# Patient Record
Sex: Female | Born: 1980 | Race: Black or African American | Hispanic: No | Marital: Married | Smoking: Never smoker
Health system: Southern US, Community
[De-identification: ages and names within clinical notes are randomized; demographics above are authoritative.]

## PROBLEM LIST (undated history)

## (undated) DIAGNOSIS — T7840XA Allergy, unspecified, initial encounter: Secondary | ICD-10-CM

## (undated) DIAGNOSIS — K219 Gastro-esophageal reflux disease without esophagitis: Secondary | ICD-10-CM

## (undated) DIAGNOSIS — N63 Unspecified lump in unspecified breast: Secondary | ICD-10-CM

## (undated) DIAGNOSIS — F419 Anxiety disorder, unspecified: Secondary | ICD-10-CM

## (undated) DIAGNOSIS — R011 Cardiac murmur, unspecified: Secondary | ICD-10-CM

## (undated) HISTORY — DX: Gastro-esophageal reflux disease without esophagitis: K21.9

## (undated) HISTORY — DX: Anxiety disorder, unspecified: F41.9

## (undated) HISTORY — DX: Allergy, unspecified, initial encounter: T78.40XA

## (undated) HISTORY — DX: Cardiac murmur, unspecified: R01.1

---
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
## 2001-06-21 ENCOUNTER — Other Ambulatory Visit: Admission: RE | Admit: 2001-06-21 | Discharge: 2001-06-21 | Payer: Self-pay

## 2005-04-29 ENCOUNTER — Emergency Department (HOSPITAL_COMMUNITY): Admission: EM | Admit: 2005-04-29 | Discharge: 2005-04-29 | Payer: Self-pay | Admitting: Emergency Medicine

## 2006-12-04 ENCOUNTER — Emergency Department (HOSPITAL_COMMUNITY): Admission: EM | Admit: 2006-12-04 | Discharge: 2006-12-04 | Payer: Self-pay | Admitting: Emergency Medicine

## 2007-04-24 ENCOUNTER — Emergency Department (HOSPITAL_COMMUNITY): Admission: EM | Admit: 2007-04-24 | Discharge: 2007-04-24 | Payer: Self-pay | Admitting: Family Medicine

## 2008-12-04 ENCOUNTER — Encounter: Admission: RE | Admit: 2008-12-04 | Discharge: 2009-03-04 | Payer: Self-pay | Admitting: Obstetrics and Gynecology

## 2009-03-30 ENCOUNTER — Inpatient Hospital Stay (HOSPITAL_COMMUNITY): Admission: AD | Admit: 2009-03-30 | Discharge: 2009-03-30 | Payer: Self-pay | Admitting: Obstetrics and Gynecology

## 2009-06-07 ENCOUNTER — Inpatient Hospital Stay (HOSPITAL_COMMUNITY): Admission: AD | Admit: 2009-06-07 | Discharge: 2009-06-09 | Payer: Self-pay | Admitting: Obstetrics and Gynecology

## 2009-08-13 ENCOUNTER — Encounter: Payer: Self-pay | Admitting: Family Medicine

## 2009-08-19 ENCOUNTER — Ambulatory Visit: Payer: Self-pay | Admitting: Family Medicine

## 2009-08-19 DIAGNOSIS — E669 Obesity, unspecified: Secondary | ICD-10-CM | POA: Insufficient documentation

## 2009-08-19 DIAGNOSIS — E282 Polycystic ovarian syndrome: Secondary | ICD-10-CM | POA: Insufficient documentation

## 2010-05-06 NOTE — Miscellaneous (Signed)
Summary: nutrition appt  Clinical Lists Changes rec'd a form showing that she has an appt with Dr. Gerilyn Pilgrim at [facility] 08/19/09. she is not here that am. can offer 3pm that day or another day. asked that she call us back to reschedule..referral form to Dr. Gerilyn Pilgrim chart box.Marland KitchenMarland KitchenGolden Circle RN  Aug 13, 2009 3:53 PM   I was checking the wrong day. she does have an appt that day. called her to tell her appt time & date are fine. she laughed & was going to keep this appt. I apologized for looking at the wrong day.Golden Circle RN  Aug 13, 2009 4:10 PM

## 2010-05-06 NOTE — Assessment & Plan Note (Signed)
Summary: NP,TCB   Vital Signs:  Patient profile:   30 year old female Height:      67 inches Weight:      275.9 pounds BMI:     43.37  Vitals Entered By: Wyona Almas PHD (Aug 19, 2009 8:54 AM)  History of Present Illness: Assessment:  Spent 60 minutes with pt.  Crystal Salinas lives with her 10-wk-old son and 5-YO daughter.  She has been diagnosed w/ PCOS, and was taking Metformin as well as phentermine prior to being pregnant w/ her son.  She has a strong family hx of both DM and CVD, and wants to avoid this for herself.  Tamala eats 3 meals and variable snacks daily.  24-hr recall suggests kcal intake of 1500-1600: (Slept 8 - 10 AM after work Sat 7 p-Sun 7 a); L (2:30 PM)- [place] sandwich w/ cheese, pickles, 1 tbsp mayo, 3 grape tomatoes, water, 1 oz chips; Snk (9 PM)- Fiber One bar; D (11:30 PM)- chsburger w/ let & tomato, pickles, 7 onion rings, water; Snk (3 AM)- protein drink, 1 apple; B (7:30 AM)- 3 pancakes, 1 tbsp margarine, 1 tbsp syrup, water.  Chairty walks with a friend 3-4 miles 3-4 X wk.  She has recently started trying to take stairs vs. elevator, etc.  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  Not sure yet.    Nutrition Diagnosis:  Excessive energy intake (NI-1.5) related to expenditure as evidenced by BMI of 43.  Inappropriate intake of types of carbohydrate (NI-53.3) related to starchy food choices as evidenced by cheeseburger, oinion rings, and pancakes consumed yesterday.   Intervention: See Patient Instructions.    Monitoring/Eval:  Dietary intake, body weight, and exercise at 3-4-wk F/U.       Other Orders: Inital Assessment Each - [facility] [phone])  Patient Instructions: 1)  Do your best to get adequate sleep, i.e., plan your Sundays when you will go to church, and plan your sleep around that schedule.   2)  No more than 5 hours between eating. Eat at least 3 meals and 1-2 snacks per day.  3)  When  choosing carbohydrates, look for those that are low-glycemic (high-fiber).   4)  Obtain twice as many veg's as protein or carbohydrate foods for both lunch and dinner.  Add frozen veg's to spaghetti sauce, soup, etc.   5)  Limit fat; buy low-fat dressing, mayo, etc.  Choose lean meats, such as fish, poultry, and beef/pork tenderloin. 6)  Limit fish to 6 oz 2 X wk (less is probably better).   7)  Continue your walking, and pay attention to getting as much activity as you can during your regular day.   8)  Schedule an appt for 3-4 wk.   9)  Call Dr. Gerilyn Pilgrim at [phone] if any Qs.

## 2010-06-30 LAB — CBC
HCT: 41 % (ref 36.0–46.0)
MCV: 76.9 fL — ABNORMAL LOW (ref 78.0–100.0)
MCV: 77.9 fL — ABNORMAL LOW (ref 78.0–100.0)
RDW: 17.5 % — ABNORMAL HIGH (ref 11.5–15.5)
WBC: 10.1 10*3/uL (ref 4.0–10.5)

## 2010-06-30 LAB — RPR: RPR Ser Ql: NONREACTIVE

## 2010-07-07 LAB — URINALYSIS, ROUTINE W REFLEX MICROSCOPIC
Glucose, UA: NEGATIVE mg/dL
Protein, ur: NEGATIVE mg/dL
pH: 6 (ref 5.0–8.0)

## 2010-07-07 LAB — URINE CULTURE

## 2011-01-16 LAB — RAPID STREP SCREEN (MED CTR MEBANE ONLY): Streptococcus, Group A Screen (Direct): NEGATIVE

## 2011-04-07 NOTE — L&D Delivery Note (Signed)
Delivery Note Pt went from 2cm to C/C?+2 in 20 minutes after getting up to urinate.  At 2:36 AM a viable female was delivered via  (Presentation ROA: ;  ).  APGAR:9/9 , ; weight .  pending Placenta status: intact with 3VC , .  Cord:  with the following complications:none .  Cord pH: n/a  Anesthesia:  none Episiotomy: none Lacerations: none Suture Repair: n/a Est. Blood Loss (mL): 200  Mom to postpartum.  Baby to nursery-stable.  CRESENZO-DISHMAN,Nilsa Macht 01/14/2012, 2:57 AM

## 2011-07-09 ENCOUNTER — Telehealth (HOSPITAL_COMMUNITY): Payer: Self-pay | Admitting: Dietician

## 2011-07-09 LAB — OB RESULTS CONSOLE HEPATITIS B SURFACE ANTIGEN: Hepatitis B Surface Ag: NEGATIVE

## 2011-07-09 LAB — OB RESULTS CONSOLE HIV ANTIBODY (ROUTINE TESTING): HIV: NONREACTIVE

## 2011-07-09 LAB — OB RESULTS CONSOLE RUBELLA ANTIBODY, IGM: Rubella: IMMUNE

## 2011-07-09 LAB — OB RESULTS CONSOLE RPR: RPR: NONREACTIVE

## 2011-07-09 NOTE — Telephone Encounter (Signed)
Received referral via fax from [facility] for dx: obesity, pregnancy, pt request.

## 2011-07-09 NOTE — Telephone Encounter (Signed)
Appointment scheduled for 07/13/2011 at 2:00 PM. Mailed appointment confirmation to pt home via [place] Mail.

## 2011-07-13 ENCOUNTER — Telehealth (HOSPITAL_COMMUNITY): Payer: Self-pay | Admitting: Dietician

## 2011-07-13 NOTE — Telephone Encounter (Signed)
Appointment rescheduled for 07/16/11 at 11:00 AM.

## 2011-07-16 ENCOUNTER — Telehealth (HOSPITAL_COMMUNITY): Payer: Self-pay | Admitting: Dietician

## 2011-07-16 NOTE — Telephone Encounter (Signed)
Left pt message that appointment scheduled on 07/16/11 at 11 AM to be rescheduled due to emergency conflict. Offered appointment later today or on Monday 07/20/11 or Wednesday, 07/22/11.

## 2011-07-20 NOTE — Telephone Encounter (Signed)
Pt has not responded to voicemails. Sent letter to pt home via [place] [employer] in attempt to contact pt to schedule appointment.

## 2011-07-23 NOTE — Telephone Encounter (Signed)
Pt has not responded to multiple attempts to contact to reschedule appointment. Referral filed.

## 2012-01-05 LAB — OB RESULTS CONSOLE ABO/RH: RH Type: POSITIVE

## 2012-01-08 ENCOUNTER — Inpatient Hospital Stay (HOSPITAL_COMMUNITY)
Admission: AD | Admit: 2012-01-08 | Discharge: 2012-01-08 | Disposition: A | Discharge status: Home / Self Care | Payer: Medicaid Other | Source: Ambulatory Visit | Attending: Obstetrics & Gynecology | Admitting: Obstetrics & Gynecology

## 2012-01-08 ENCOUNTER — Encounter (HOSPITAL_COMMUNITY): Payer: Self-pay | Admitting: *Deleted

## 2012-01-08 DIAGNOSIS — R1031 Right lower quadrant pain: Secondary | ICD-10-CM | POA: Insufficient documentation

## 2012-01-08 DIAGNOSIS — O479 False labor, unspecified: Secondary | ICD-10-CM | POA: Insufficient documentation

## 2012-01-08 NOTE — MAU Note (Signed)
Was going to work and had sharp pain in RLQ. Now having sharp, cramping in lower abd.

## 2012-01-08 NOTE — MAU Note (Signed)
Patient off efm to walk for one hour per P. Thad Ranger MD

## 2012-01-08 NOTE — MAU Provider Note (Signed)
  History     CSN: [id number]  Arrival date and time: 01/08/12 1930   None     Chief Complaint  Patient presents with  . Abdominal Pain   HPI 31 y.o. G3P2002 at [redacted]w[redacted]d by 11 week sono with lower right sided pain since about 6:30 PM. Losing mucous plug all week. In prior pregnancies did not feel strong contractions at all. Baby moving well. Not sure about loss of fluid. Normal pregnancy. Last baby did not feel contractions. "Loose" 1 cm in office on Tuesday. Last baby weighed 9 lb, 12 oz per mom, no problems with delivery.  OB History    Grav Para Term Preterm Abortions TAB SAB Ect Mult Living   3 2 2       2       Past Medical History  Diagnosis Date  . No pertinent past medical history     No past surgical history on file.  No family history on file.  History  Substance Use Topics  . Smoking status: Never Smoker   . Smokeless tobacco: Never Used  . Alcohol Use: No    Allergies: Allergies not on file  No prescriptions prior to admission    Review of Systems  Constitutional: Negative for fever and chills.  Eyes: Negative for blurred vision and double vision.  Gastrointestinal: Positive for abdominal pain. Negative for nausea and vomiting.  Neurological: Negative for headaches.   Physical Exam   Blood pressure 120/77, pulse 94, temperature 97.7 F (36.5 C), temperature source Oral, resp. rate 20, height 5\' 7"  (1.702 m), weight 132.45 kg (292 lb).  Physical Exam  Constitutional: She is oriented to person, place, and time. She appears well-developed and well-nourished. No distress.  HENT:  Head: Normocephalic and atraumatic.  Eyes: Conjunctivae normal and EOM are normal.  Neck: Normal range of motion. Neck supple.  Cardiovascular: Normal rate.   Respiratory: Effort normal. No respiratory distress.  GI: Soft. There is no tenderness. There is no rebound and no guarding.  Genitourinary: Vagina normal.  Musculoskeletal: Normal range of motion. She exhibits no  edema and no tenderness.  Neurological: She is alert and oriented to person, place, and time.  Skin: Skin is warm and dry.  Psychiatric: She has a normal mood and affect.   Dilation: 1.5 Effacement (%): 80 Cervical Position: Posterior Station: -1 Presentation: Vertex Exam by:: Napoleon Form MD  FHTs: 150, moderate variability, accels present, no decels TOCO:  Ctx q 3-4 min  MAU Course  Procedures  Recheck after one hour no change, but feeling contractions a bit more.   Assessment and Plan  31 y.o. G3P2002 at [redacted]w[redacted]d with 1. Contractions without cervical change. 2. Discharge home - labor precaution discussed.    Napoleon Form 01/08/2012, 8:24 PM

## 2012-01-13 ENCOUNTER — Inpatient Hospital Stay (HOSPITAL_COMMUNITY)
Admission: AD | Admit: 2012-01-13 | Discharge: 2012-01-15 | DRG: 775 | Disposition: A | Discharge status: Home / Self Care | Payer: Medicaid Other | Source: Ambulatory Visit | Attending: Obstetrics & Gynecology | Admitting: Obstetrics & Gynecology

## 2012-01-13 ENCOUNTER — Encounter (HOSPITAL_COMMUNITY): Payer: Self-pay | Admitting: Obstetrics

## 2012-01-13 LAB — CBC
HCT: 38.6 % (ref 36.0–46.0)
Hemoglobin: 12.6 g/dL (ref 12.0–15.0)
MCH: 25 pg — ABNORMAL LOW (ref 26.0–34.0)
MCHC: 32.6 g/dL (ref 30.0–36.0)

## 2012-01-13 MED ORDER — CITRIC ACID-SODIUM CITRATE 334-500 MG/5ML PO SOLN: 30.0000 mL | ORAL | Status: DC | PRN

## 2012-01-13 MED ORDER — IBUPROFEN 600 MG PO TABS
600.0000 mg | ORAL_TABLET | Freq: Four times a day (QID) | ORAL | Status: DC | PRN
  Administered 2012-01-14: 600 mg via ORAL
  Filled 2012-01-13: qty 1

## 2012-01-13 MED ORDER — HYDROXYZINE HCL 50 MG/ML IM SOLN
50.0000 mg | Freq: Four times a day (QID) | INTRAMUSCULAR | Status: DC | PRN
  Filled 2012-01-13: qty 1

## 2012-01-13 MED ORDER — HYDROXYZINE HCL 50 MG PO TABS: 50.0000 mg | ORAL_TABLET | Freq: Four times a day (QID) | ORAL | Status: DC | PRN

## 2012-01-13 MED ORDER — LACTATED RINGERS IV SOLN
INTRAVENOUS | Status: DC
  Administered 2012-01-13: via INTRAVENOUS

## 2012-01-13 MED ORDER — OXYCODONE-ACETAMINOPHEN 5-325 MG PO TABS: 1.0000 | ORAL_TABLET | ORAL | Status: DC | PRN

## 2012-01-13 MED ORDER — FLEET ENEMA 7-19 GM/118ML RE ENEM: 1.0000 | ENEMA | RECTAL | Status: DC | PRN

## 2012-01-13 MED ORDER — ONDANSETRON HCL 4 MG/2ML IJ SOLN: 4.0000 mg | Freq: Four times a day (QID) | INTRAMUSCULAR | Status: DC | PRN

## 2012-01-13 MED ORDER — OXYTOCIN BOLUS FROM INFUSION
500.0000 mL | Freq: Once | INTRAVENOUS | Status: AC
  Administered 2012-01-14: 500 mL via INTRAVENOUS
  Filled 2012-01-13: qty 500

## 2012-01-13 MED ORDER — LIDOCAINE HCL (PF) 1 % IJ SOLN
30.0000 mL | INTRAMUSCULAR | Status: DC | PRN
  Filled 2012-01-13: qty 30

## 2012-01-13 MED ORDER — OXYTOCIN 40 UNITS IN LACTATED RINGERS INFUSION - SIMPLE MED
62.5000 mL/h | Freq: Once | INTRAVENOUS | Status: AC
  Administered 2012-01-14: 62.5 mL/h via INTRAVENOUS
  Filled 2012-01-13: qty 1000

## 2012-01-13 MED ORDER — ACETAMINOPHEN 325 MG PO TABS: 650.0000 mg | ORAL_TABLET | ORAL | Status: DC | PRN

## 2012-01-13 MED ORDER — LACTATED RINGERS IV SOLN: 500.0000 mL | INTRAVENOUS | Status: DC | PRN

## 2012-01-13 MED ORDER — FENTANYL CITRATE 0.05 MG/ML IJ SOLN
100.0000 ug | INTRAMUSCULAR | Status: DC | PRN
  Administered 2012-01-14: 100 ug via INTRAVENOUS
  Filled 2012-01-13: qty 2

## 2012-01-13 NOTE — H&P (Signed)
  MYRTLENE SERVATIUS is a 31 y.o. female G3P2002 with IUP at [redacted]w[redacted]d presenting for ROM at 2130. Pt states she has been having irregular, every 10 minutes contractions, associated with none vaginal bleeding, membranes are ruptured, clear fluid, with active fetal movement.   [facility]  since 8 wks  Prenatal History/Complications: none Past Medical History: Past Medical History  Diagnosis Date  . No pertinent past medical history     Past Surgical History: History reviewed. No pertinent past surgical history.  Obstetrical History: OB History    Grav Para Term Preterm Abortions TAB SAB Ect Mult Living   3 2 2       2       Gynecological History: OB History    Grav Para Term Preterm Abortions TAB SAB Ect Mult Living   3 2 2       2       Social History: History   Social History  . Marital Status: Single    Spouse Name: N/A    Number of Children: N/A  . Years of Education: N/A   Social History Main Topics  . Smoking status: Never Smoker   . Smokeless tobacco: Never Used  . Alcohol Use: No  . Drug Use:   . Sexually Active: Yes   Other Topics Concern  . None   Social History Narrative  . None    Family History: History reviewed. No pertinent family history.  Allergies: No Known Allergies  Prescriptions prior to admission  Medication Sig Dispense Refill  . acyclovir (ZOVIRAX) 200 MG capsule Take by mouth every 4 (four) hours while awake.      . Prenatal Vit-Fe Fumarate-FA (PRENATAL MULTIVITAMIN) TABS Take 1 tablet by mouth daily.        Review of Systems - Negative    Blood pressure 119/36, pulse 89, temperature 98.7 F (37.1 C), temperature source Oral, resp. rate 20, height 5\' 7"  (1.702 m), weight 291 lb (131.997 kg), SpO2 100.00%. General appearance: alert, cooperative, no distress and moderately obese Lungs: clear to auscultation bilaterally Heart: regular rate and rhythm Abdomen: soft, non-tender; bowel sounds normal Pelvic: leaking clear  fluid, fern + Extremities: Homans sign is negative, no sign of DVT DTR's 2+ Presentation: cephalic Fetal monitoringBaseline: 150 bpm, Variability: Good {> 6 bpm), Accelerations: Reactive and Decelerations: Absent Uterine activity mild contractions q 3-5 minutes Dilation: 2 Effacement (%): 50 Station: -2 Exam by:: Rodena Piety CNM   Prenatal labs: ABO, Rh: O/Positive/-- (10/01 0000) Antibody: Negative (10/01 0000) Rubella:  immune RPR: Nonreactive (04/04 0000)  HBsAg: Negative (04/04 0000)  HIV: Non-reactive (04/04 0000)  GBS: Negative (10/01 0000)  2 hr Glucola 86/127/117 Genetic screening  normal Anatomy US normal  . Assessment: FLOYDENE LOOBY is a 31 y.o. [id number] with an IUP at [redacted]w[redacted]d presenting for ROM/early labor  Plan: Pt wants to avoid epidural and feels like she is starting to labor, to will manage expectantly   CRESENZO-DISHMAN,Aftin Lye 01/13/2012, 11:55 PM

## 2012-01-14 ENCOUNTER — Encounter (HOSPITAL_COMMUNITY): Payer: Self-pay | Admitting: Obstetrics

## 2012-01-14 LAB — RPR: RPR Ser Ql: NONREACTIVE

## 2012-01-14 MED ORDER — SIMETHICONE 80 MG PO CHEW: 80.0000 mg | CHEWABLE_TABLET | ORAL | Status: DC | PRN

## 2012-01-14 MED ORDER — IBUPROFEN 600 MG PO TABS
600.0000 mg | ORAL_TABLET | Freq: Four times a day (QID) | ORAL | Status: DC
  Administered 2012-01-14 – 2012-01-15 (×5): 600 mg via ORAL
  Filled 2012-01-14 (×6): qty 1

## 2012-01-14 MED ORDER — METHYLERGONOVINE MALEATE 0.2 MG/ML IJ SOLN: 0.2000 mg | INTRAMUSCULAR | Status: DC | PRN

## 2012-01-14 MED ORDER — OXYCODONE-ACETAMINOPHEN 5-325 MG PO TABS
1.0000 | ORAL_TABLET | ORAL | Status: DC | PRN
  Administered 2012-01-14 (×2): 1 via ORAL
  Filled 2012-01-14 (×2): qty 1

## 2012-01-14 MED ORDER — TETANUS-DIPHTH-ACELL PERTUSSIS 5-2.5-18.5 LF-MCG/0.5 IM SUSP: 0.5000 mL | Freq: Once | INTRAMUSCULAR | Status: DC

## 2012-01-14 MED ORDER — PRENATAL MULTIVITAMIN CH
1.0000 | ORAL_TABLET | Freq: Every day | ORAL | Status: DC
  Administered 2012-01-14 – 2012-01-15 (×2): 1 via ORAL
  Filled 2012-01-14 (×2): qty 1

## 2012-01-14 MED ORDER — LANOLIN HYDROUS EX OINT: TOPICAL_OINTMENT | CUTANEOUS | Status: DC | PRN

## 2012-01-14 MED ORDER — MEASLES, MUMPS & RUBELLA VAC ~~LOC~~ INJ
0.5000 mL | INJECTION | Freq: Once | SUBCUTANEOUS | Status: DC
  Filled 2012-01-14: qty 0.5

## 2012-01-14 MED ORDER — BENZOCAINE-MENTHOL 20-0.5 % EX AERO: 1.0000 "application " | INHALATION_SPRAY | CUTANEOUS | Status: DC | PRN

## 2012-01-14 MED ORDER — MAGNESIUM HYDROXIDE 400 MG/5ML PO SUSP: 30.0000 mL | ORAL | Status: DC | PRN

## 2012-01-14 MED ORDER — FERROUS SULFATE 325 (65 FE) MG PO TABS
325.0000 mg | ORAL_TABLET | Freq: Two times a day (BID) | ORAL | Status: DC
  Administered 2012-01-14 – 2012-01-15 (×3): 325 mg via ORAL
  Filled 2012-01-14 (×3): qty 1

## 2012-01-14 MED ORDER — DIPHENHYDRAMINE HCL 25 MG PO CAPS: 25.0000 mg | ORAL_CAPSULE | Freq: Four times a day (QID) | ORAL | Status: DC | PRN

## 2012-01-14 MED ORDER — WITCH HAZEL-GLYCERIN EX PADS: 1.0000 "application " | MEDICATED_PAD | CUTANEOUS | Status: DC | PRN

## 2012-01-14 MED ORDER — ONDANSETRON HCL 4 MG PO TABS: 4.0000 mg | ORAL_TABLET | ORAL | Status: DC | PRN

## 2012-01-14 MED ORDER — METHYLERGONOVINE MALEATE 0.2 MG PO TABS: 0.2000 mg | ORAL_TABLET | ORAL | Status: DC | PRN

## 2012-01-14 MED ORDER — DIBUCAINE 1 % RE OINT: 1.0000 "application " | TOPICAL_OINTMENT | RECTAL | Status: DC | PRN

## 2012-01-14 MED ORDER — ZOLPIDEM TARTRATE 5 MG PO TABS: 5.0000 mg | ORAL_TABLET | Freq: Every evening | ORAL | Status: DC | PRN

## 2012-01-14 MED ORDER — SENNOSIDES-DOCUSATE SODIUM 8.6-50 MG PO TABS
2.0000 | ORAL_TABLET | Freq: Every day | ORAL | Status: DC
  Administered 2012-01-14: 2 via ORAL

## 2012-01-14 MED ORDER — ONDANSETRON HCL 4 MG/2ML IJ SOLN: 4.0000 mg | INTRAMUSCULAR | Status: DC | PRN

## 2012-01-14 NOTE — Progress Notes (Signed)
UR Chart review completed.  

## 2012-01-15 MED ORDER — IBUPROFEN 600 MG PO TABS: 600.0000 mg | ORAL_TABLET | Freq: Four times a day (QID) | ORAL | Status: DC

## 2012-01-15 NOTE — Discharge Summary (Signed)
Obstetric Discharge Summary Reason for Admission: onset of labor and rupture of membranes Prenatal Procedures: none Intrapartum Procedures: spontaneous vaginal delivery Postpartum Procedures: none Complications-Operative and Postpartum: none  Subjective:  Crystal Salinas 31 y.o. [id number]   Patient reports doing well today with very minimal vaginal tenderness. She has been ambulating, eating/drinking, voiding, +flatus. No BM yet.  Breast feeding is going well.  She is currently unsure what want to do for contraception. She plans to discuss this further at her postpartum visit.      Hemoglobin  Date Value Range Status  01/13/2012 12.6  12.0 - 15.0 g/dL Final     HCT  Date Value Range Status  01/13/2012 38.6  36.0 - 46.0 % Final    Filed Vitals:   01/14/12 0500 01/14/12 0852 01/14/12 2045 01/15/12 0512  BP: 114/71 99/51 110/75 135/77  Pulse: 86 87 91 92  Temp:   98 F (36.7 C) 98.5 F (36.9 C)  TempSrc:   Oral Oral  Resp: 18 20 18 18   Height:      Weight:      SpO2:         Physical Exam:  General: alert, cooperative and no distress Cardio: normal rate, 2+ DP pulses bilateral  Pulm: normal rate, no respiratory distress Lochia: appropriate Uterine Fundus: firm DVT Evaluation: No evidence of DVT seen on physical exam. Negative Homan's sign. No cords or calf tenderness. No significant calf/ankle edema.  Discharge Diagnoses: Term Pregnancy-delivered  Discharge Information: Date: 01/15/2012 Activity: pelvic rest Diet: routine Medications: Ibuprofen and Colace Condition: stable Instructions: refer to practice specific booklet Discharge to: home Contraception: Patient unsure what she wants to do at this time. She is considering multiple options. Plans do discuss this further at postpartum visit.   Newborn Data: Live born female  Birth Weight: 7 lb 5.2 oz (3323 g) APGAR: 9, 9  Home with mother.  Winfield Cunas 01/15/2012, 7:36 AM

## 2012-01-18 NOTE — Discharge Summary (Signed)
I saw and examined patient and agree with above. Gaelle Adriance, MD 

## 2012-06-18 ENCOUNTER — Encounter (HOSPITAL_COMMUNITY): Payer: Self-pay | Admitting: *Deleted

## 2012-06-18 ENCOUNTER — Emergency Department (HOSPITAL_COMMUNITY)
Admission: EM | Admit: 2012-06-18 | Discharge: 2012-06-18 | Disposition: A | Discharge status: Home / Self Care | Payer: Self-pay | Attending: Emergency Medicine | Admitting: Emergency Medicine

## 2012-06-18 DIAGNOSIS — Y939 Activity, unspecified: Secondary | ICD-10-CM | POA: Insufficient documentation

## 2012-06-18 DIAGNOSIS — X58XXXA Exposure to other specified factors, initial encounter: Secondary | ICD-10-CM | POA: Insufficient documentation

## 2012-06-18 DIAGNOSIS — S39012A Strain of muscle, fascia and tendon of lower back, initial encounter: Secondary | ICD-10-CM

## 2012-06-18 DIAGNOSIS — Y929 Unspecified place or not applicable: Secondary | ICD-10-CM | POA: Insufficient documentation

## 2012-06-18 DIAGNOSIS — S335XXA Sprain of ligaments of lumbar spine, initial encounter: Secondary | ICD-10-CM | POA: Insufficient documentation

## 2012-06-18 DIAGNOSIS — Z87440 Personal history of urinary (tract) infections: Secondary | ICD-10-CM | POA: Insufficient documentation

## 2012-06-18 DIAGNOSIS — IMO0001 Reserved for inherently not codable concepts without codable children: Secondary | ICD-10-CM | POA: Insufficient documentation

## 2012-06-18 DIAGNOSIS — Z8619 Personal history of other infectious and parasitic diseases: Secondary | ICD-10-CM | POA: Insufficient documentation

## 2012-06-18 LAB — URINALYSIS, ROUTINE W REFLEX MICROSCOPIC
Glucose, UA: NEGATIVE mg/dL
Ketones, ur: NEGATIVE mg/dL
Leukocytes, UA: NEGATIVE
Nitrite: NEGATIVE
Protein, ur: NEGATIVE mg/dL
Urobilinogen, UA: 0.2 mg/dL (ref 0.0–1.0)

## 2012-06-18 MED ORDER — CYCLOBENZAPRINE HCL 5 MG PO TABS: 5.0000 mg | ORAL_TABLET | Freq: Three times a day (TID) | ORAL | Status: DC | PRN

## 2012-06-18 NOTE — ED Notes (Signed)
Lower back pain that started yesterday.  Denies injury.

## 2012-06-21 NOTE — ED Provider Notes (Signed)
History     CSN: [id number]  Arrival date & time 06/18/12  1243   First MD Initiated Contact with Patient 06/18/12 1343      Chief Complaint  Patient presents with  . Back Pain    (Consider location/radiation/quality/duration/timing/severity/associated sxs/prior treatment) HPI Comments: Crystal Salinas is a 32 y.o. Female presenting with low back pain which gradually started yesterday.  Pain is aching and she describes sensation of muscle spasm across her right lower back.  She does have a history of uti's in the past and is concerned about possible infection.  She denies dysuria, hematuria and has had no fevers or chills.  She has taken ibuprofen with no relief of symptoms.  She denies injury and there is no radiation of pain into her lower extremities and denies weakness or numbness. She has had no urinary or bowel retention or incontinence.     The history is provided by the patient.    Past Medical History  Diagnosis Date  . No pertinent past medical history   . HSV (herpes simplex virus) anogenital infection     History reviewed. No pertinent past surgical history.  No family history on file.  History  Substance Use Topics  . Smoking status: Never Smoker   . Smokeless tobacco: Never Used  . Alcohol Use: No    OB History   Grav Para Term Preterm Abortions TAB SAB Ect Mult Living   3 3 3       3       Review of Systems  Constitutional: Negative for fever.  Respiratory: Negative for shortness of breath.   Cardiovascular: Negative for chest pain and leg swelling.  Gastrointestinal: Negative for abdominal pain, constipation and abdominal distention.  Genitourinary: Negative for dysuria, urgency, frequency, flank pain and difficulty urinating.  Musculoskeletal: Positive for myalgias and back pain. Negative for joint swelling and gait problem.  Skin: Negative for rash.  Neurological: Negative for weakness and numbness.    Allergies  Review of patient's allergies  indicates no known allergies.  Home Medications   Current Outpatient Rx  Name  Route  Sig  Dispense  Refill  . ibuprofen (ADVIL,MOTRIN) 600 MG tablet   Oral   Take 600 mg by mouth every 6 (six) hours as needed for pain.         . Prenatal Vit-Fe Fumarate-FA (PRENATAL MULTIVITAMIN) TABS   Oral   Take 1 tablet by mouth daily.         . cyclobenzaprine (FLEXERIL) 5 MG tablet   Oral   Take 1 tablet (5 mg total) by mouth 3 (three) times daily as needed for muscle spasms.   15 tablet   0     BP 121/64  Pulse 94  Temp(Src) 98.2 F (36.8 C) (Oral)  Resp 16  Ht 5\' 7"  (1.702 m)  Wt 280 lb (127.007 kg)  BMI 43.84 kg/m2  SpO2 97%  Breastfeeding? Yes  Physical Exam  Nursing note and vitals reviewed. Constitutional: She appears well-developed and well-nourished.  HENT:  Head: Normocephalic.  Eyes: Conjunctivae are normal.  Neck: Normal range of motion. Neck supple.  Cardiovascular: Normal rate and intact distal pulses.   Pedal pulses normal.  Pulmonary/Chest: Effort normal.  Abdominal: Soft. Bowel sounds are normal. She exhibits no distension and no mass.  Musculoskeletal: Normal range of motion. She exhibits no edema.       Lumbar back: She exhibits tenderness. She exhibits no swelling, no edema and no spasm.  TTP right  mid and lower paralumbar musculature,  No midline ttp.    Neurological: She is alert. She has normal strength. She displays no atrophy and no tremor. No sensory deficit. Gait normal.  Reflex Scores:      Patellar reflexes are 2+ on the right side and 2+ on the left side.      Achilles reflexes are 2+ on the right side and 2+ on the left side. No strength deficit noted in hip and knee flexor and extensor muscle groups.  Ankle flexion and extension intact.  Skin: Skin is warm and dry.  Psychiatric: She has a normal mood and affect.    ED Course  Procedures (including critical care time)  Labs Reviewed  URINALYSIS, ROUTINE W REFLEX MICROSCOPIC -  Abnormal; Notable for the following:    APPearance HAZY (*)    All other components within normal limits   No results found.   1. Lumbar spine strain, initial encounter       MDM  Pt with obvious muscle spasm on exam with pain that is reproducible.  Pt was encouraged to continue using ibuprofen,  Heat therapy,  Prescribed flexeril.  Recheck if not improving over the next week.    Patients labs and/or radiological studies were viewed and considered during the medical decision making and disposition process. The patient appears reasonably screened and/or stabilized for discharge and I doubt any other medical condition or other [facility] requiring further screening, evaluation, or treatment in the ED at this time prior to discharge.         Burgess Amor, PA-C 06/21/12 1726  Burgess Amor, PA-C 06/21/12 1727

## 2012-06-24 NOTE — ED Provider Notes (Signed)
Medical screening examination/treatment/procedure(s) were performed by non-physician practitioner and as supervising physician I was immediately available for consultation/collaboration.  Erine Phenix, MD 06/24/12 0937 

## 2012-07-15 ENCOUNTER — Ambulatory Visit: Payer: Self-pay

## 2012-07-15 ENCOUNTER — Other Ambulatory Visit: Payer: Self-pay | Admitting: Occupational Medicine

## 2012-07-15 DIAGNOSIS — R7612 Nonspecific reaction to cell mediated immunity measurement of gamma interferon antigen response without active tuberculosis: Secondary | ICD-10-CM

## 2013-07-05 ENCOUNTER — Other Ambulatory Visit: Payer: Self-pay | Admitting: Obstetrics & Gynecology

## 2013-07-05 ENCOUNTER — Other Ambulatory Visit (HOSPITAL_COMMUNITY)
Admission: RE | Admit: 2013-07-05 | Discharge: 2013-07-05 | Disposition: A | Discharge status: Home / Self Care | Payer: BC Managed Care – PPO | Source: Ambulatory Visit | Attending: Obstetrics & Gynecology | Admitting: Obstetrics & Gynecology

## 2013-07-05 DIAGNOSIS — Z113 Encounter for screening for infections with a predominantly sexual mode of transmission: Secondary | ICD-10-CM | POA: Insufficient documentation

## 2013-07-05 DIAGNOSIS — Z01419 Encounter for gynecological examination (general) (routine) without abnormal findings: Secondary | ICD-10-CM | POA: Insufficient documentation

## 2013-07-05 DIAGNOSIS — Z1151 Encounter for screening for human papillomavirus (HPV): Secondary | ICD-10-CM | POA: Insufficient documentation

## 2013-07-11 ENCOUNTER — Other Ambulatory Visit: Payer: Self-pay | Admitting: Obstetrics & Gynecology

## 2013-10-15 IMAGING — CR DG CHEST 1V
1 series · 1 of 1 positions shown · non-contrast
Comparison: None.

CLINICAL DATA: History of positive QFTG test for tuberculosis.

CHEST - 1 VIEW

[view not recorded]
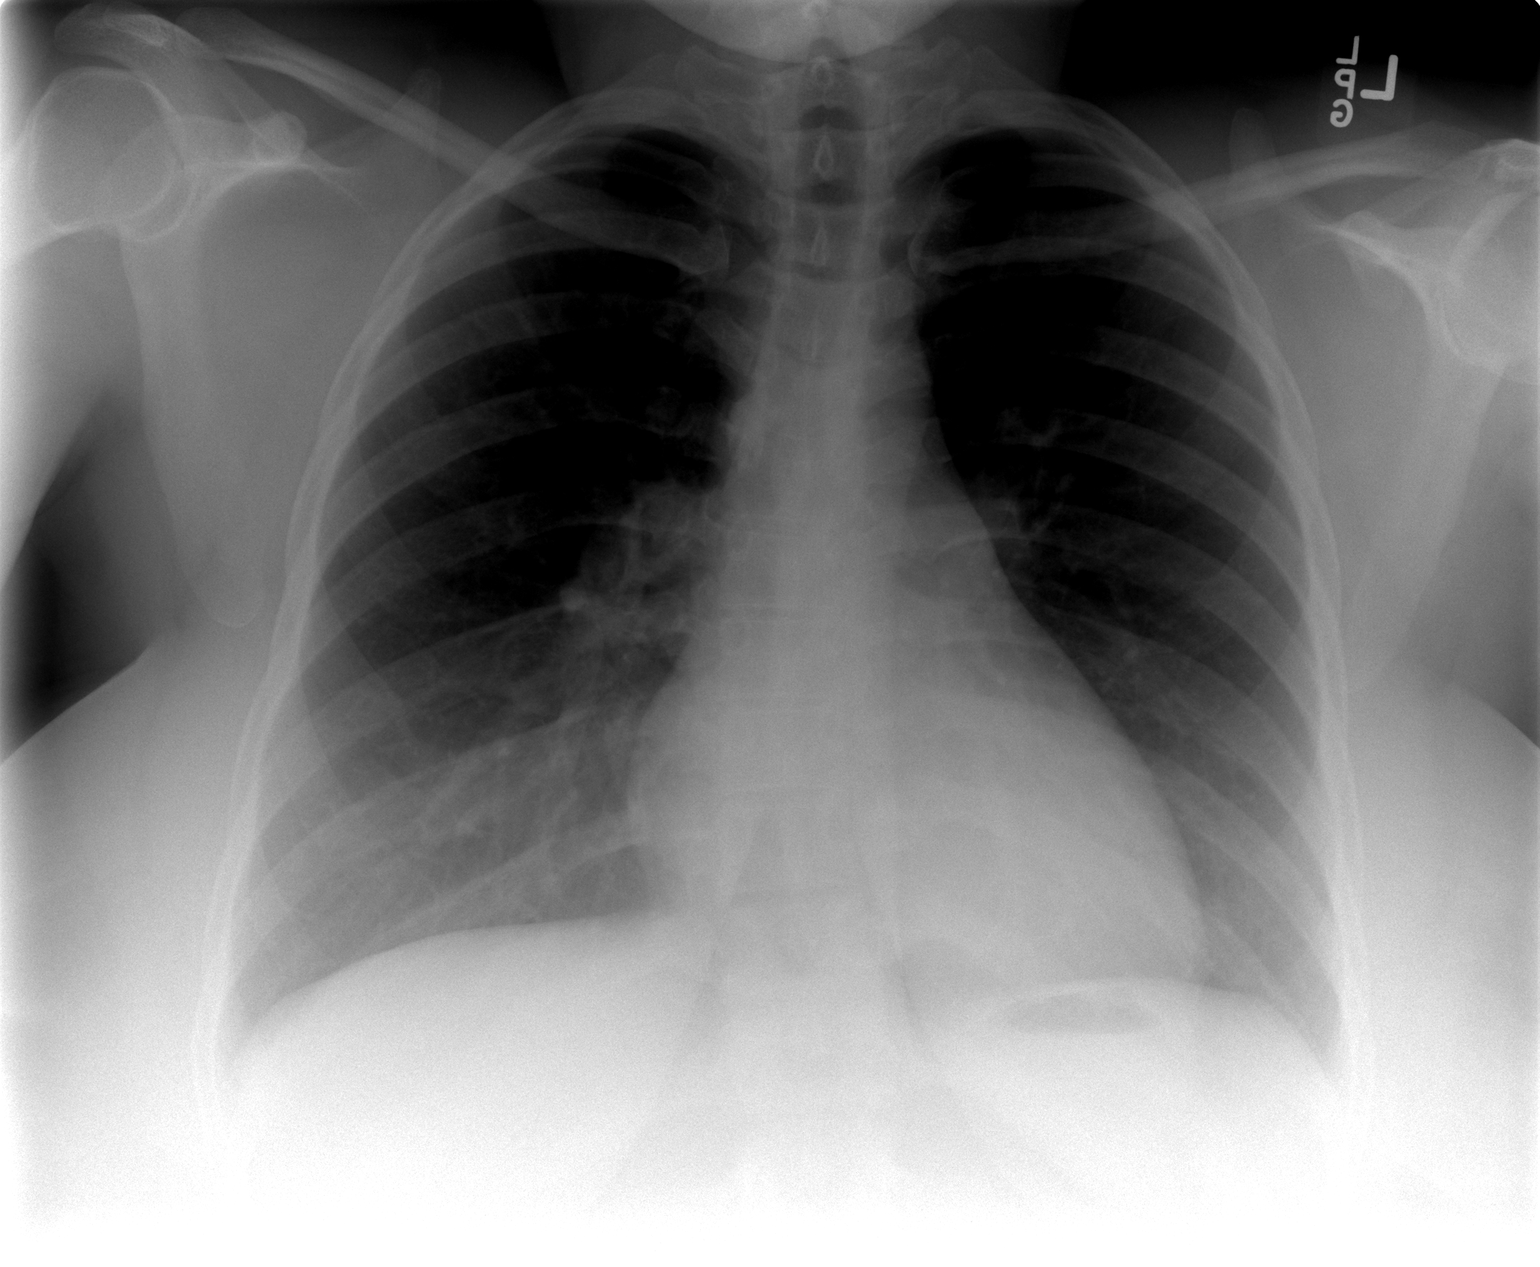

[1 of 1 positions shown; findings below may reference images not displayed]

FINDINGS: Cardiac silhouette is normal size and shape.  Mediastinal
and hilar contours appear normal with no evidence of adenopathy.
No pulmonary infiltrates or masses are seen.  No cavitation is
evident. No pleural abnormality is evident. Bones appear average
for age.
IMPRESSION: No acute or active cardiopulmonary or pleural abnormalities are
seen.  No radiographic evidence of tuberculosis is seen.

## 2014-02-05 ENCOUNTER — Encounter (HOSPITAL_COMMUNITY): Payer: Self-pay | Admitting: *Deleted

## 2015-09-27 ENCOUNTER — Emergency Department (HOSPITAL_COMMUNITY)
Admission: EM | Admit: 2015-09-27 | Discharge: 2015-09-27 | Disposition: A | Discharge status: Home / Self Care | Payer: Self-pay | Attending: Emergency Medicine | Admitting: Emergency Medicine

## 2015-09-27 ENCOUNTER — Encounter (HOSPITAL_COMMUNITY): Payer: Self-pay | Admitting: Emergency Medicine

## 2015-09-27 DIAGNOSIS — M5432 Sciatica, left side: Secondary | ICD-10-CM

## 2015-09-27 DIAGNOSIS — M5442 Lumbago with sciatica, left side: Secondary | ICD-10-CM | POA: Insufficient documentation

## 2015-09-27 MED ORDER — IBUPROFEN 600 MG PO TABS: 600.0000 mg | ORAL_TABLET | Freq: Four times a day (QID) | ORAL | Status: DC | PRN

## 2015-09-27 MED ORDER — METHYLPREDNISOLONE SODIUM SUCC 40 MG IJ SOLR
125.0000 mg | Freq: Once | INTRAMUSCULAR | Status: AC
  Administered 2015-09-27: 125 mg via INTRAMUSCULAR
  Filled 2015-09-27: qty 4

## 2015-09-27 MED ORDER — KETOROLAC TROMETHAMINE 60 MG/2ML IM SOLN
60.0000 mg | Freq: Once | INTRAMUSCULAR | Status: AC
  Administered 2015-09-27: 60 mg via INTRAMUSCULAR
  Filled 2015-09-27: qty 2

## 2015-09-27 MED ORDER — CYCLOBENZAPRINE HCL 5 MG PO TABS: 5.0000 mg | ORAL_TABLET | Freq: Three times a day (TID) | ORAL | Status: DC | PRN

## 2015-09-27 NOTE — ED Provider Notes (Signed)
CSN: [id number]     Arrival date & time 09/27/15  1034 History   First MD Initiated Contact with Patient 09/27/15 1113     Chief Complaint  Patient presents with  . Hip Pain     (Consider location/radiation/quality/duration/timing/severity/associated sxs/prior Treatment) Patient is a 35 y.o. female presenting with hip pain. The history is provided by the patient. No language interpreter was used.  Hip Pain This is a new problem. The current episode started 1 to 4 weeks ago. The problem occurs constantly. The problem has been gradually worsening. Associated symptoms include myalgias. Pertinent negatives include no joint swelling. Nothing aggravates the symptoms. She has tried nothing for the symptoms. The treatment provided moderate relief.    Past Medical History  Diagnosis Date  . No pertinent past medical history   . HSV (herpes simplex virus) anogenital infection    History reviewed. No pertinent past surgical history. History reviewed. No pertinent family history. Social History  Substance Use Topics  . Smoking status: Never Smoker   . Smokeless tobacco: Never Used  . Alcohol Use: No   OB History    Gravida Para Term Preterm AB TAB SAB Ectopic Multiple Living   3 3 3       3      Review of Systems  Musculoskeletal: Positive for myalgias and back pain. Negative for joint swelling.  All other systems reviewed and are negative.     Allergies  Review of patient's allergies indicates no known allergies.  Home Medications   Prior to Admission medications   Medication Sig Start Date End Date Taking? Authorizing Provider  cyclobenzaprine (FLEXERIL) 5 MG tablet Take 1 tablet (5 mg total) by mouth 3 (three) times daily as needed for muscle spasms. 09/27/15   Elson AreasLeslie K Sofia, PA-C  ibuprofen (ADVIL,MOTRIN) 600 MG tablet Take 1 tablet (600 mg total) by mouth every 6 (six) hours as needed. 09/27/15   Elson AreasLeslie K Sofia, PA-C  Prenatal Vit-Fe Fumarate-FA (PRENATAL MULTIVITAMIN) TABS  Take 1 tablet by mouth daily.    Historical Provider, MD   BP 104/83 mmHg  Pulse 85  Temp(Src) 98.5 F (36.9 C) (Oral)  Resp 18  Ht 5\' 7"  (1.702 m)  Wt 124.286 kg  BMI 42.90 kg/m2  SpO2 98%  LMP 09/13/2015 Physical Exam  Constitutional: She is oriented to person, place, and time. She appears well-developed and well-nourished.  HENT:  Head: Normocephalic and atraumatic.  Eyes: Conjunctivae and EOM are normal. Pupils are equal, round, and reactive to light.  Neck: Normal range of motion.  Cardiovascular: Normal rate.   Pulmonary/Chest: Effort normal.  Abdominal: She exhibits no distension.  Musculoskeletal: She exhibits tenderness.  Tender mid low back and left sciatic notch,  Pain with movement,  nv and ns intact  Neurological: She is alert and oriented to person, place, and time.  Skin: Skin is warm.  Psychiatric: She has a normal mood and affect.  Nursing note and vitals reviewed.   ED Course  Procedures (including critical care time) Labs Review Labs Reviewed - No data to display  Imaging Review No results found. I have personally reviewed and evaluated these images and lab results as part of my medical decision-making.   EKG Interpretation None      MDM   Final diagnoses:  Sciatica, left    An After Visit Summary was printed and given to the patient. Meds ordered this encounter  Medications  . methylPREDNISolone sodium succinate (SOLU-MEDROL) 40 mg/mL injection 125 mg  Sig:   . ketorolac (TORADOL) injection 60 mg    Sig:   . cyclobenzaprine (FLEXERIL) 5 MG tablet    Sig: Take 1 tablet (5 mg total) by mouth 3 (three) times daily as needed for muscle spasms.    Dispense:  15 tablet    Refill:  0    Order Specific Question:  Supervising Provider    Answer:  MILLER, BRIAN [[id number]]  . ibuprofen (ADVIL,MOTRIN) 600 MG tablet    Sig: Take 1 tablet (600 mg total) by mouth every 6 (six) hours as needed.    Dispense:  30 tablet    Refill:  0    Order Specific  Question:  Supervising Provider    Answer:  Eber HongMILLER, BRIAN [[id number]]      Lonia SkinnerLeslie K TowerSofia, PA-C 09/27/15 1433  Samuel JesterKathleen McManus, DO 10/02/15 1245

## 2015-09-27 NOTE — ED Notes (Signed)
Having pain to left hip.  Working out for last 5-6 weeks, rates pain 2/10.  Pain increases when getting out of bed.  Work out 6 days a week.

## 2015-09-27 NOTE — Discharge Instructions (Signed)

## 2015-09-27 NOTE — ED Notes (Signed)
Left sided lower back pain that radiates into leg, and into foot.

## 2016-03-10 ENCOUNTER — Inpatient Hospital Stay (HOSPITAL_COMMUNITY): Admission: RE | Admit: 2016-03-10 | Payer: Self-pay

## 2016-08-04 ENCOUNTER — Other Ambulatory Visit (HOSPITAL_COMMUNITY): Payer: Self-pay | Admitting: *Deleted

## 2016-08-04 DIAGNOSIS — N6320 Unspecified lump in the left breast, unspecified quadrant: Secondary | ICD-10-CM

## 2016-08-05 ENCOUNTER — Ambulatory Visit (HOSPITAL_COMMUNITY)
Admission: RE | Admit: 2016-08-05 | Discharge: 2016-08-05 | Disposition: A | Discharge status: Home / Self Care | Payer: PRIVATE HEALTH INSURANCE | Source: Ambulatory Visit | Attending: *Deleted | Admitting: *Deleted

## 2016-08-05 ENCOUNTER — Encounter (HOSPITAL_COMMUNITY): Payer: Self-pay

## 2016-08-05 DIAGNOSIS — N6320 Unspecified lump in the left breast, unspecified quadrant: Secondary | ICD-10-CM

## 2016-08-05 HISTORY — DX: Unspecified lump in unspecified breast: N63.0

## 2017-01-07 ENCOUNTER — Emergency Department (HOSPITAL_COMMUNITY): Payer: PRIVATE HEALTH INSURANCE

## 2017-01-07 ENCOUNTER — Encounter (HOSPITAL_COMMUNITY): Payer: Self-pay | Admitting: *Deleted

## 2017-01-07 ENCOUNTER — Emergency Department (HOSPITAL_COMMUNITY)
Admission: EM | Admit: 2017-01-07 | Discharge: 2017-01-07 | Disposition: A | Discharge status: Home / Self Care | Payer: PRIVATE HEALTH INSURANCE | Attending: Emergency Medicine | Admitting: Emergency Medicine

## 2017-01-07 DIAGNOSIS — R51 Headache: Secondary | ICD-10-CM | POA: Insufficient documentation

## 2017-01-07 DIAGNOSIS — R072 Precordial pain: Secondary | ICD-10-CM

## 2017-01-07 DIAGNOSIS — F419 Anxiety disorder, unspecified: Secondary | ICD-10-CM | POA: Insufficient documentation

## 2017-01-07 LAB — CBC
HEMATOCRIT: 38.7 % (ref 36.0–46.0)
HEMOGLOBIN: 12.4 g/dL (ref 12.0–15.0)
MCH: 25.4 pg — AB (ref 26.0–34.0)
MCHC: 32 g/dL (ref 30.0–36.0)
MCV: 79.1 fL (ref 78.0–100.0)
Platelets: 380 10*3/uL (ref 150–400)
RBC: 4.89 MIL/uL (ref 3.87–5.11)
RDW: 14.5 % (ref 11.5–15.5)
WBC: 6.4 10*3/uL (ref 4.0–10.5)

## 2017-01-07 LAB — BASIC METABOLIC PANEL
ANION GAP: 10 (ref 5–15)
BUN: 11 mg/dL (ref 6–20)
CO2: 26 mmol/L (ref 22–32)
Calcium: 9.2 mg/dL (ref 8.9–10.3)
Chloride: 98 mmol/L — ABNORMAL LOW (ref 101–111)
Creatinine, Ser: 0.76 mg/dL (ref 0.44–1.00)
GLUCOSE: 127 mg/dL — AB (ref 65–99)
POTASSIUM: 3.4 mmol/L — AB (ref 3.5–5.1)
Sodium: 134 mmol/L — ABNORMAL LOW (ref 135–145)

## 2017-01-07 LAB — TROPONIN I: Troponin I: <0.03 ng/mL (ref <0.03)

## 2017-01-07 MED ORDER — ACETAMINOPHEN 325 MG PO TABS
650.0000 mg | ORAL_TABLET | Freq: Once | ORAL | Status: AC
  Administered 2017-01-07: 650 mg via ORAL
  Filled 2017-01-07: qty 2

## 2017-01-07 NOTE — ED Provider Notes (Signed)
[facility]-EMERGENCY DEPT Provider Note   CSN: [id number] Arrival date & time: 01/07/17  1528     History   Chief Complaint Chief Complaint  Patient presents with  . Chest Pain    HPI Crystal Salinas is a 36 y.o. female.  HPI   Crystal Salinas is a 36 y.o. female who presents to the Emergency Department complaining of previous episode of chest pain that began while she was waiting for her child to be released from school. She describes a pressure sensation to the middle of her chest with numbness and tingling to the bilateral upper and lower extremities. Symptoms lasted for several minutes before resolving.Symptoms associated with a mild frontal headache. Symptoms improved prior to ER arrival. She states that she has been under an increased amount of stress recently. She denies nausea, vomiting, shortness of breath, abdominal pain, facial numbness or weakness. Also denies alcohol or tobacco or illicit drug use.  Past Medical History:  Diagnosis Date  . Breast mass    left breast mass  . HSV (herpes simplex virus) anogenital infection   . No pertinent past medical history     Patient Active Problem List   Diagnosis Date Noted  . POLYCYSTIC OVARIAN DISEASE 08/19/2009  . OBESITY 08/19/2009    History reviewed. No pertinent surgical history.  OB History    Gravida Para Term Preterm AB Living   SAB TAB Ectopic Multiple Live Births           1       Home Medications    Prior to Admission medications   Medication Sig Start Date End Date Taking? Authorizing Provider  cyclobenzaprine (FLEXERIL) 5 MG tablet Take 1 tablet (5 mg total) by mouth 3 (three) times daily as needed for muscle spasms. 09/27/15   Elson Areas, PA-C  ibuprofen (ADVIL,MOTRIN) 600 MG tablet Take 1 tablet (600 mg total) by mouth every 6 (six) hours as needed. 09/27/15   Elson Areas, PA-C  Prenatal Vit-Fe Fumarate-FA (PRENATAL MULTIVITAMIN) TABS Take 1 tablet by mouth daily.    [provider]    Family History No family history on file.  Social History Social History  Substance Use Topics  . Smoking status: Never Smoker  . Smokeless tobacco: Never Used  . Alcohol use No     Allergies   Patient has no known allergies.   Review of Systems Review of Systems  Constitutional: Negative for appetite change and fever.  HENT: Negative for ear pain and sore throat.   Eyes: Negative for visual disturbance.  Respiratory: Negative for cough, chest tightness and shortness of breath.   Cardiovascular: Positive for chest pain.  Gastrointestinal: Negative for abdominal pain, nausea and vomiting.  Genitourinary: Negative for dysuria, frequency and hematuria.  Musculoskeletal: Negative for back pain and neck pain.  Skin: Negative for rash.  Neurological: Positive for numbness (numbness and tingling of both arms and legs.  ). Negative for dizziness, syncope, weakness and headaches.  Hematological: Does not bruise/bleed easily.  Psychiatric/Behavioral: The patient is not nervous/anxious.      Physical Exam Updated Vital Signs BP 135/66 (BP Location: Right Arm)   Pulse (!) 129   Temp 98 F (36.7 C) (Oral)   Resp 18   SpO2 100%   Physical Exam  Constitutional: She is oriented to person, place, and time. She appears well-developed and well-nourished. No distress.  HENT:  Head: Atraumatic.  Mouth/Throat: Oropharynx  is clear and moist.  Eyes: Pupils are equal, round, and reactive to light. Conjunctivae and EOM are normal.  Neck: Normal range of motion. Neck supple. No JVD present. No tracheal deviation present.  Cardiovascular: Normal rate, regular rhythm, normal heart sounds and intact distal pulses.   No murmur heard. Pulmonary/Chest: Effort normal and breath sounds normal. No respiratory distress. She exhibits no tenderness.  Abdominal: Soft. She exhibits no distension. There is no tenderness. There is no guarding.  Neurological: She is alert and oriented  to person, place, and time. She has normal strength. No sensory deficit. Gait normal. GCS eye subscore is 4. GCS verbal subscore is 5. GCS motor subscore is 6.  CN III-XII intact, no facial droop, speech clear, no pronator drift.   Skin: Skin is warm. Capillary refill takes less than 2 seconds. No rash noted.  Psychiatric: She has a normal mood and affect. Her behavior is normal. Thought content normal.  Nursing note and vitals reviewed.    ED Treatments / Results  Labs (all labs ordered are listed, but only abnormal results are displayed) Labs Reviewed  BASIC METABOLIC PANEL - Abnormal; Notable for the following:       Result Value   Sodium 134 (*)    Potassium 3.4 (*)    Chloride 98 (*)    Glucose, Bld 127 (*)    All other components within normal limits  CBC - Abnormal; Notable for the following:    MCH 25.4 (*)    All other components within normal limits  TROPONIN I    EKG  EKG Interpretation  Date/Time:  Thursday January 07 2017 15:41:34 EDT Ventricular Rate:  98 PR Interval:  158 QRS Duration: 84 QT Interval:  344 QTC Calculation: 439 R Axis:   77 Text Interpretation:  Normal sinus rhythm Normal ECG No old tracing to compare Confirmed by Mancel Bale [id number] on 01/07/2017 3:50:10 PM       Radiology Dg Chest 2 View  Result Date: 01/07/2017 CLINICAL DATA:  Central chest pain, bilateral tingling in arms earlier today. Denies symptoms currently. Productive cough x 2 months. Denies sob. No prior history of heart or lung conditions. History of breast mass. EXAM: CHEST  2 VIEW COMPARISON:  07/15/2012 FINDINGS: The heart size and mediastinal contours are within normal limits. Both lungs are clear. The visualized skeletal structures are unremarkable. IMPRESSION: No active cardiopulmonary disease. Electronically Signed   By: Norva Pavlov M.D.   On: 01/07/2017 16:14    Procedures Procedures (including critical care time)  Medications Ordered in ED Medications    acetaminophen (TYLENOL) tablet 650 mg (650 mg Oral Given 01/07/17 1724)     Initial Impression / Assessment and Plan / ED Course  I have reviewed the triage vital signs and the nursing notes.  Pertinent labs & imaging results that were available during my care of the patient were reviewed by me and considered in my medical decision making (see chart for details).     Vitals:   01/07/17 1535 01/07/17 1706  BP: 135/66 103/61  Pulse: (!) 129 94  Resp: 18 20  Temp: 98 F (36.7 C)   SpO2: 100% 100%   vitals improved.  Pt is feeling better and symptoms resolved upon arrival.  Patient has been ambulating in the dept., gait is steady.  No concerning sx's for PE.  Doubt ACS.  Findings discussed.  Return precautions also discussed.   Final Clinical Impressions(s) / ED Diagnoses   Final diagnoses:  Precordial  pain  Anxiety    New Prescriptions New Prescriptions   No medications on file     Pauline Aus, Cordelia Poche 01/08/17 2317    Jacalyn Lefevre, MD 01/13/17 1530

## 2017-01-07 NOTE — Discharge Instructions (Signed)
Follow-up with your primary provider in 1-2 days for recheck.  Return to ER for any worsening symptoms

## 2017-01-07 NOTE — ED Triage Notes (Signed)
Pt denies any chest pressure at this time.

## 2017-01-07 NOTE — ED Triage Notes (Addendum)
Pt comes in for chest pain starting today while she was at her children's school. Pt told EMS she has been under a lot of stress lately. Pt has bilateral tingling. NAD noted. Pt is alert and oriented. Denies any chest pain or tingling at this time.

## 2017-01-12 ENCOUNTER — Other Ambulatory Visit: Payer: Self-pay

## 2017-01-12 ENCOUNTER — Encounter (HOSPITAL_COMMUNITY): Payer: Self-pay

## 2017-01-12 ENCOUNTER — Emergency Department (HOSPITAL_COMMUNITY)
Admission: EM | Admit: 2017-01-12 | Discharge: 2017-01-12 | Disposition: A | Discharge status: Home / Self Care | Payer: PRIVATE HEALTH INSURANCE | Attending: Emergency Medicine | Admitting: Emergency Medicine

## 2017-01-12 ENCOUNTER — Emergency Department (HOSPITAL_COMMUNITY): Payer: PRIVATE HEALTH INSURANCE

## 2017-01-12 DIAGNOSIS — R1013 Epigastric pain: Secondary | ICD-10-CM

## 2017-01-12 DIAGNOSIS — R079 Chest pain, unspecified: Secondary | ICD-10-CM

## 2017-01-12 LAB — BASIC METABOLIC PANEL
Anion gap: 9 (ref 5–15)
BUN: 9 mg/dL (ref 6–20)
CO2: 25 mmol/L (ref 22–32)
CREATININE: 0.9 mg/dL (ref 0.44–1.00)
Calcium: 9.7 mg/dL (ref 8.9–10.3)
Chloride: 102 mmol/L (ref 101–111)
GFR calc Af Amer: >60 mL/min (ref >60)
Glucose, Bld: 103 mg/dL — ABNORMAL HIGH (ref 65–99)
POTASSIUM: 4 mmol/L (ref 3.5–5.1)
SODIUM: 136 mmol/L (ref 135–145)

## 2017-01-12 LAB — CBC
HEMATOCRIT: 40.7 % (ref 36.0–46.0)
Hemoglobin: 13.4 g/dL (ref 12.0–15.0)
MCH: 26.1 pg (ref 26.0–34.0)
MCHC: 32.9 g/dL (ref 30.0–36.0)
MCV: 79.3 fL (ref 78.0–100.0)
PLATELETS: 439 10*3/uL — AB (ref 150–400)
RBC: 5.13 MIL/uL — ABNORMAL HIGH (ref 3.87–5.11)
RDW: 14.5 % (ref 11.5–15.5)
WBC: 7.9 10*3/uL (ref 4.0–10.5)

## 2017-01-12 LAB — I-STAT TROPONIN, ED
TROPONIN I, POC: 0.02 ng/mL (ref 0.00–0.08)
TROPONIN I, POC: 0 ng/mL (ref 0.00–0.08)

## 2017-01-12 MED ORDER — GI COCKTAIL ~~LOC~~
30.0000 mL | Freq: Once | ORAL | Status: AC
  Administered 2017-01-12: 30 mL via ORAL
  Filled 2017-01-12: qty 30

## 2017-01-12 MED ORDER — PANTOPRAZOLE SODIUM 20 MG PO TBEC: 20.0000 mg | DELAYED_RELEASE_TABLET | Freq: Every day | ORAL | 0 refills | Status: DC

## 2017-01-12 MED ORDER — FAMOTIDINE 20 MG PO TABS
20.0000 mg | ORAL_TABLET | Freq: Once | ORAL | Status: AC
  Administered 2017-01-12: 20 mg via ORAL
  Filled 2017-01-12: qty 1

## 2017-01-12 NOTE — ED Triage Notes (Signed)
Pt states chest discomfort for several days. She describes it that her heart feels "heavy." Skin warm and dry. Pt seen at [facility] and told probable anxiety. Also concerned for gall bladder.

## 2017-01-12 NOTE — ED Provider Notes (Signed)
[facility]-EMERGENCY DEPT Provider Note   CSN: [id number] Arrival date & time: 01/12/17  1809     History   Chief Complaint Chief Complaint  Patient presents with  . Chest Pain    HPI Crystal Salinas is a 36 y.o. female.  The history is provided by the patient and medical records. No language interpreter was used.  Chest Pain   Associated symptoms include nausea. Pertinent negatives include no abdominal pain, no palpitations and no vomiting.   Crystal Salinas is a 36 y.o. female  with a PMH of PCOS who presents to the Emergency Department complaining of central squeezing chest pain just prior to arrival. She states that shortly after eating the center of her chest just felt tight. Pain improved after sitting straight up for about 3-4 minutes. She states that this has been occurring off-and-on for the last week. Associated with nausea, but no vomiting. Denies shortness of breath. No known sick contacts. She was seen at ED on 10/04 for similar when she was told this was due to anxiety. She [facility] this morning who "didn't give me answers". She states that she just wants to know why she feels this way, therefore came to ED. No recent travel/surgeries/mobilizations. Not on oral contraceptives. No history of DVT or PE. Family cardiac history. No history of hypertension, hyperlipidemia, diabetes or heart disease.  Past Medical History:  Diagnosis Date  . Breast mass    left breast mass  . HSV (herpes simplex virus) anogenital infection   . No pertinent past medical history     Patient Active Problem List   Diagnosis Date Noted  . POLYCYSTIC OVARIAN DISEASE 08/19/2009  . OBESITY 08/19/2009    History reviewed. No pertinent surgical history.  OB History    Gravida Para Term Preterm AB Living   SAB TAB Ectopic Multiple Live Births           1       Home Medications    Prior to Admission medications   Medication Sig Start Date End Date Taking?  Authorizing Provider  diphenhydramine-acetaminophen (TYLENOL PM) 25-500 MG TABS tablet Take 1 tablet by mouth at bedtime as needed.   Yes [provider]  ibuprofen (ADVIL,MOTRIN) 600 MG tablet Take 1 tablet (600 mg total) by mouth every 6 (six) hours as needed. 09/27/15  Yes Cheron Schaumann K, PA-C  Phenylephrine-DM-GG-APAP (SUDAFED PE COLD/COUGH PO) Take 2 tablets by mouth daily as needed.   Yes [provider]  cyclobenzaprine (FLEXERIL) 5 MG tablet Take 1 tablet (5 mg total) by mouth 3 (three) times daily as needed for muscle spasms. Patient not taking: Reported on 01/12/2017 09/27/15   Elson Areas, PA-C  pantoprazole (PROTONIX) 20 MG tablet Take 1 tablet (20 mg total) by mouth daily. 01/12/17   Ward, Chase Picket, PA-C    Family History History reviewed. No pertinent family history.  Social History Social History  Substance Use Topics  . Smoking status: Never Smoker  . Smokeless tobacco: Never Used  . Alcohol use No     Allergies   Patient has no known allergies.   Review of Systems Review of Systems  Cardiovascular: Positive for chest pain. Negative for palpitations and leg swelling.  Gastrointestinal: Positive for nausea. Negative for abdominal pain, blood in stool, constipation, diarrhea and vomiting.  All other systems reviewed and are negative.    Physical Exam Updated Vital Signs BP  112/63   Pulse 88   Temp 98.3 F (36.8 C) (Oral)   Resp 17   Ht  (1.651 m)   Wt 124.3 kg (274 lb)   SpO2 100%   BMI 45.60 kg/m   Physical Exam  Constitutional: She is oriented to person, place, and time. She appears well-developed and well-nourished. No distress.  HENT:  Head: Normocephalic and atraumatic.  Neck: No JVD present.  Cardiovascular: Normal rate, regular rhythm and normal heart sounds.   No murmur heard. Pulmonary/Chest: Effort normal and breath sounds normal. No respiratory distress. She has no wheezes. She has no rales. She exhibits  tenderness.  Abdominal: Soft. She exhibits no distension. There is tenderness.  Musculoskeletal: She exhibits no edema.  Neurological: She is alert and oriented to person, place, and time.  Skin: Skin is warm and dry.  Nursing note and vitals reviewed.    ED Treatments / Results  Labs (all labs ordered are listed, but only abnormal results are displayed) Labs Reviewed  BASIC METABOLIC PANEL - Abnormal; Notable for the following:       Result Value   Glucose, Bld 103 (*)    All other components within normal limits  CBC - Abnormal; Notable for the following:    RBC 5.13 (*)    Platelets 439 (*)    All other components within normal limits  I-STAT TROPONIN, ED  I-STAT TROPONIN, ED    EKG  EKG Interpretation  Date/Time:  Tuesday January 12 2017 18:13:08 EDT Ventricular Rate:  97 PR Interval:  134 QRS Duration: 84 QT Interval:  336 QTC Calculation: 426 R Axis:   73 Text Interpretation:  Normal sinus rhythm Nonspecific ST abnormality Abnormal ECG No significant change since last tracing Confirmed by Richardean Canal [phone]) on 01/12/2017 8:43:03 PM       Radiology Dg Chest 2 View  Result Date: 01/12/2017 CLINICAL DATA:  Chest pain. EXAM: CHEST  2 VIEW COMPARISON:  Chest x-ray dated January 07, 2017. FINDINGS: The heart size and mediastinal contours are within normal limits. Both lungs are clear. The visualized skeletal structures are unremarkable. IMPRESSION: No active cardiopulmonary disease. Electronically Signed   By: Obie Dredge M.D.   On: 01/12/2017 19:01    Procedures Procedures (including critical care time)  Medications Ordered in ED Medications  famotidine (PEPCID) tablet 20 mg (20 mg Oral Given 01/12/17 2122)  gi cocktail (Maalox,Lidocaine,Donnatal) (30 mLs Oral Given 01/12/17 2209)     Initial Impression / Assessment and Plan / ED Course  I have reviewed the triage vital signs and the nursing notes.  Pertinent labs & imaging results that were available  during my care of the patient were reviewed by me and considered in my medical decision making (see chart for details).    Crystal Salinas is a 36 y.o. female who presents to ED for lower chest / epigastric pain. Evaluated in ED on 10/04: Chart review from this encounter. Patient also was seen by primary care today for similar. Labs reviewed and reassuring with negative troponin 2. Chest x-ray with no acute abnormalities. EKG unchanged from previous. Heart score of 2, low risk. Patient does have epigastric tenderness and her symptoms returned after eating [place] sandwich and applesauce. GI cocktail provided with improvement of symptoms. Will start on PPI and have her follow up with GI. Patient's symptoms unlikely to be of cardiac etiology. Labs and imaging reviewed again prior to discharge. Patient has been advised to return to the ED if development  of any exertional chest pain, trouble breathing, new/worsening symptoms or for any additional concerns. Evaluation does not show pathology that would require ongoing emergent intervention or inpatient treatment. Referral information included in discharge paperwork. Patient understands return precautions and follow up plan. All questions answered.   Final Clinical Impressions(s) / ED Diagnoses   Final diagnoses:  Chest pain with low risk for cardiac etiology  Epigastric pain    New Prescriptions Discharge Medication List as of 01/12/2017 10:06 PM    START taking these medications   Details  pantoprazole (PROTONIX) 20 MG tablet Take 1 tablet (20 mg total) by mouth daily., Starting Tue 01/12/2017, Print         Ward, Chase Picket, PA-C 01/13/17 0006    Charlynne Pander, MD 01/13/17 1501

## 2017-01-12 NOTE — ED Notes (Signed)
Patient up to [facility] inquiring about the wait time. Explained that she is very close to being next but EMS traffic is heavy and taking beds. Encouraged patient to stay a bit longer.

## 2017-01-12 NOTE — Discharge Instructions (Signed)
It was my pleasure taking care of you today!   Fortunately your lab work, x-ray and EKG were all very reassuring.   Please take Protonix daily and follow up with your primary care provider and the GI clinic listed.   Return to ER for new or worsening symptoms, any additional concerns.

## 2017-01-13 ENCOUNTER — Encounter: Payer: Self-pay | Admitting: Nurse Practitioner

## 2017-01-22 ENCOUNTER — Ambulatory Visit: Payer: Self-pay | Admitting: Nurse Practitioner

## 2017-02-02 ENCOUNTER — Ambulatory Visit (INDEPENDENT_AMBULATORY_CARE_PROVIDER_SITE_OTHER): Payer: Self-pay | Admitting: Physician Assistant

## 2017-02-02 ENCOUNTER — Encounter: Payer: Self-pay | Admitting: Physician Assistant

## 2017-02-02 VITALS — BP 126/72 | HR 72 | Ht 65.0 in | Wt 265.4 lb

## 2017-02-02 DIAGNOSIS — R1013 Epigastric pain: Secondary | ICD-10-CM

## 2017-02-02 DIAGNOSIS — R110 Nausea: Secondary | ICD-10-CM

## 2017-02-02 MED ORDER — OMEPRAZOLE 20 MG PO CPDR: 20.0000 mg | DELAYED_RELEASE_CAPSULE | Freq: Two times a day (BID) | ORAL | 3 refills | Status: DC

## 2017-02-02 NOTE — Patient Instructions (Addendum)
You have been scheduled for an endoscopy. Please follow written instructions given to you at your visit today. If you use inhalers (even only as needed), please bring them with you on the day of your procedure. Your physician has requested that you go to www.startemmi.com and enter the access code given to you at your visit today. This web site gives a general overview about your procedure. However, you should still follow specific instructions given to you by our office regarding your preparation for the procedure.  We have sent the following medications to your pharmacy for you to pick up at your convenience: Omeprazole 20 mg twice a day  We have given you a handout on GERD with dietary guidelines. Please strive to adhere to these standards.

## 2017-02-02 NOTE — Progress Notes (Signed)
Chief Complaint: Epigastric pain, Nausea  HPI:  Crystal Salinas is a [age] year old African-American female with a past medical history as listed below who presents to clinic today as a new patient for a complaint of epigastric pain and nausea.   Per review of chart patient has been seen in the ED on 2 separate occasions for precordial pain and chest pain thought related to gastritis. The first of these visits was 01/07/17 when patient described some numbness and tingling in her bilateral upper and lower extremities as well as a sensation of pressure in the middle of her chest.  Labs at that time were within normal limits including a CBC, BMP and troponin. EKG was also normal.  Patient's symptoms resolved in the ER.  The next visit was 01/12/17, again with a feeling of chest tightness.  Patient had labs at that time with a normal BMP, CBC and 2 troponins. EKG was also normal. Patient was given a GI cocktail which relieved her symptoms.   Today, the patient was in his clinic and explains that she is a high anxiety/stress person. She tells me that 7 years ago when she was pregnant she had awful reflux , so much that they "had to put me on medicine". Patient had not had trouble then since the birth of her child 7 years ago. A couple of weeks ago the patient describes the event that led to her first ER visit. She described a pressure in her chest which developed when she was picking her children from school. She hadn't eaten in a while and thought this was related but then started to feel a tingling in her arms and legs. Her heart was not racing but when the EMS got to her she did have an elevated blood pressure. In the ER, she was told this was "precordial pain" as above. Patient then had similar symptoms a few days later and presented to the ER and was given a GI cocktail which relieved her symptoms.     Today, the patient explains that she has been using Omeprazole 20 mg once daily since her last ER visit and does  believe that this has helped. She does continue with some symptoms mostly at night and in the early morning. Patient tells me that she will get a "sensation of tightness in her chest as well as some nausea". Patient has lost about 10 pounds over the past month because "I don't know what to eat that will set it off or not". Patient tells me that in general she is just worried about what is going on and would like to "get to the bottom of it".   Patient denies a fever, chills, blood in her stool, melena, change in bowel habits, anorexia, vomiting, overt heartburn or reflux.   Past Medical History:  Diagnosis Date  . Breast mass    left breast mass  . HSV (herpes simplex virus) anogenital infection   . No pertinent past medical history     History reviewed. No pertinent surgical history.  Current Outpatient Prescriptions  Medication Sig Dispense Refill  . diphenhydramine-acetaminophen (TYLENOL PM) 25-500 MG TABS tablet Take 1 tablet by mouth at bedtime as needed.    Marland Kitchen. ibuprofen (ADVIL,MOTRIN) 600 MG tablet Take 1 tablet (600 mg total) by mouth every 6 (six) hours as needed. 30 tablet 0  . Phenylephrine-DM-GG-APAP (SUDAFED PE COLD/COUGH PO) Take 2 tablets by mouth daily as needed.    Marland Kitchen. omeprazole (PRILOSEC) 20 MG capsule Take 1 capsule (  20 mg total) by mouth 2 (two) times daily before a meal. 60 capsule 3   No current facility-administered medications for this visit.     Allergies as of 02/02/2017  . (No Known Allergies)    Family History: No family history of GI cancers  Social History   Social History  . Marital status: Single    Spouse name: N/A  . Number of children: N/A  . Years of education: N/A   Occupational History  . Not on file.   Social History Main Topics  . Smoking status: Never Smoker  . Smokeless tobacco: Never Used  . Alcohol use No  . Drug use: No  . Sexual activity: Yes    Birth control/ protection: Pill   Other Topics Concern  . Not on file    Social History Narrative  . No narrative on file    Review of Systems:    Constitutional: No fever or chills Skin: No rash Cardiovascular: No chest pain Respiratory: No SOB  Gastrointestinal: See HPI and otherwise negative Genitourinary: No dysuria  Neurological: No headache Musculoskeletal: No new muscle or joint pain Hematologic: No bleeding or bruising Psychiatric: Positive for anxiety   Physical Exam:  Vital signs: BP 126/72   Pulse 72   Ht 5\' 5"  (1.651 m)   Wt 265 lb 6.4 oz (120.4 kg)   BMI 44.16 kg/m   Constitutional:  Anxious overweight AA female appears to be in NAD, Well developed, Well nourished, alert and cooperative Head:  Normocephalic and atraumatic. Eyes:   PEERL, EOMI. No icterus. Conjunctiva pink. Ears:  Normal auditory acuity. Neck:  Supple Throat: Oral cavity and pharynx without inflammation, swelling or lesion.  Respiratory: Respirations even and unlabored. Lungs clear to auscultation bilaterally.   No wheezes, crackles, or rhonchi.  Cardiovascular: Normal S1, S2. No MRG. Regular rate and rhythm. No peripheral edema, cyanosis or pallor.  Gastrointestinal:  Soft, nondistended, mild epigastric ttp. No rebound or guarding. Normal bowel sounds. No appreciable masses or hepatomegaly. Rectal:  Not performed.  Msk:  Symmetrical without gross deformities. Without edema, no deformity or joint abnormality.  Neurologic:  Alert and  oriented x4;  grossly normal neurologically.  Skin:   Dry and intact without significant lesions or rashes. Psychiatric: Demonstrates good judgement and reason without abnormal affect or behaviors.  RELEVANT LABS AND IMAGING: CBC    Component Value Date/Time   WBC 7.9 01/12/2017 1820   RBC 5.13 (H) 01/12/2017 1820   HGB 13.4 01/12/2017 1820   HCT 40.7 01/12/2017 1820   PLT 439 (H) 01/12/2017 1820   MCV 79.3 01/12/2017 1820   MCH 26.1 01/12/2017 1820   MCHC 32.9 01/12/2017 1820   RDW 14.5 01/12/2017 1820    CMP      Component Value Date/Time   NA 136 01/12/2017 1820   K 4.0 01/12/2017 1820   CL 102 01/12/2017 1820   CO2 25 01/12/2017 1820   GLUCOSE 103 (H) 01/12/2017 1820   BUN 9 01/12/2017 1820   CREATININE 0.90 01/12/2017 1820   CALCIUM 9.7 01/12/2017 1820   GFRNONAA >60 01/12/2017 1820   GFRAA >60 01/12/2017 1820    Assessment: 1. Epigastric pain: worse in the morning and at night, two severe episodes with cardiac work up in ER which was negative, relieved with Gi cocktail, now on Omeprazole 20mg  qd, decrease in pain, but still with some nausea; consider relation to stress/anxiety with gastritis 2. Nausea: with above, worse in the morning and at night when laying  down  Plan: 1. Did discuss with the patient that due to the fact that she has an anxious personality and is extremely worried about the situation, we will proceed with an EGD for further evaluation. 2. Scheduled patient for an EGD with Dr. Adela Lank in the [facility]. Did discuss risks, benefits, limitations and alternatives and the patient agrees to proceed. 3. Reviewed antireflux diet and lifestyle modifications. Did provide the patient with a handout. 4. Increased Omeprazole to 20 mg twice a day, 30-60 minutes for breakfast and dinner. 5. Did reassure the patient that most likely this is an inflammation or possibly a bacteria such as H. Pylori in her stomach. 6. Patient to follow in clinic per recommendations after time of procedure with Dr. Adela Lank.  Hyacinth Meeker, PA-C [facility] 02/02/2017, 12:05 PM  Cc: No ref. provider found

## 2017-02-03 NOTE — Progress Notes (Signed)
Agree with assessment and plan as outlined.  

## 2017-02-08 ENCOUNTER — Ambulatory Visit (AMBULATORY_SURGERY_CENTER): Payer: PRIVATE HEALTH INSURANCE | Admitting: Gastroenterology

## 2017-02-08 ENCOUNTER — Encounter: Payer: Self-pay | Admitting: Gastroenterology

## 2017-02-08 VITALS — BP 123/62 | HR 68 | Temp 99.1°F | Resp 15 | Ht 65.0 in | Wt 265.0 lb

## 2017-02-08 DIAGNOSIS — R1013 Epigastric pain: Secondary | ICD-10-CM

## 2017-02-08 DIAGNOSIS — K319 Disease of stomach and duodenum, unspecified: Secondary | ICD-10-CM

## 2017-02-08 DIAGNOSIS — K2970 Gastritis, unspecified, without bleeding: Secondary | ICD-10-CM | POA: Diagnosis not present

## 2017-02-08 DIAGNOSIS — K2990 Gastroduodenitis, unspecified, without bleeding: Secondary | ICD-10-CM

## 2017-02-08 DIAGNOSIS — R110 Nausea: Secondary | ICD-10-CM

## 2017-02-08 MED ORDER — SODIUM CHLORIDE 0.9 % IV SOLN: 500.0000 mL | INTRAVENOUS | Status: DC

## 2017-02-08 NOTE — Patient Instructions (Signed)
Impression/Recommendations:  Hiatal hernia handout given to patient.  Resume previous diet. Continue present medications.  Continue Omeprazole 20 mg. Twice daily for one month to see if it continues to prevent symptoms.  Avoid NSAIDS (aspirin, naproxen, ibuprofen).  Tylenol only.  Await pathology results.    If symptoms persist despite trial of omeprazole, please contact us to discuss additional evaluation.  YOU HAD AN ENDOSCOPIC PROCEDURE TODAY AT THE [facility]:   Refer to the procedure report that was given to you for any specific questions about what was found during the examination.  If the procedure report does not answer your questions, please call your gastroenterologist to clarify.  If you requested that your care partner not be given the details of your procedure findings, then the procedure report has been included in a sealed envelope for you to review at your convenience later.  YOU SHOULD EXPECT: Some feelings of bloating in the abdomen. Passage of more gas than usual.  Walking can help get rid of the air that was put into your GI tract during the procedure and reduce the bloating. If you had a lower endoscopy (such as a colonoscopy or flexible sigmoidoscopy) you may notice spotting of blood in your stool or on the toilet paper. If you underwent a bowel prep for your procedure, you may not have a normal bowel movement for a few days.  Please Note:  You might notice some irritation and congestion in your nose or some drainage.  This is from the oxygen used during your procedure.  There is no need for concern and it should clear up in a day or so.  SYMPTOMS TO REPORT IMMEDIATELY:  Following upper endoscopy (EGD)  Vomiting of blood or coffee ground material  New chest pain or pain under the shoulder blades  Painful or persistently difficult swallowing  New shortness of breath  Fever of 100F or higher  Black, tarry-looking stools  For urgent or emergent issues,  a gastroenterologist can be reached at any hour by calling [phone].   DIET:  We do recommend a small meal at first, but then you may proceed to your regular diet.  Drink plenty of fluids but you should avoid alcoholic beverages for 24 hours.  ACTIVITY:  You should plan to take it easy for the rest of today and you should NOT DRIVE or use heavy machinery until tomorrow (because of the sedation medicines used during the test).    FOLLOW UP: Our staff will call the number listed on your records the next business day following your procedure to check on you and address any questions or concerns that you may have regarding the information given to you following your procedure. If we do not reach you, we will leave a message.  However, if you are feeling well and you are not experiencing any problems, there is no need to return our call.  We will assume that you have returned to your regular daily activities without incident.  If any biopsies were taken you will be contacted by phone or by letter within the next 1-3 weeks.  Please call us at [phone] if you have not heard about the biopsies in 3 weeks.    SIGNATURES/CONFIDENTIALITY: You and/or your care partner have signed paperwork which will be entered into your electronic medical record.  These signatures attest to the fact that that the information above on your After Visit Summary has been reviewed and is understood.  Full responsibility of the  confidentiality of this discharge information lies with you and/or your care-partner. 

## 2017-02-08 NOTE — Progress Notes (Signed)
Pt's states no medical or surgical changes since previsit or office visit. 

## 2017-02-08 NOTE — Progress Notes (Signed)
Called to room to assist during endoscopic procedure.  Patient ID and intended procedure confirmed with present staff. Received instructions for my participation in the procedure from the performing physician.  

## 2017-02-08 NOTE — Op Note (Signed)
[facility] Patient Name: Crystal Salinas Procedure Date: 02/08/2017 10:01 AM MRN: [id number] Endoscopist: Viviann Spare P. Armbruster MD, MD Age: 36 Referring MD:  Date of Birth: 1980-11-25 Gender: Female Account #: [id number] Procedure:                Upper GI endoscopy Indications:              episodic epigastric abdominal pain, Nausea -                            improved on trial of omeprazole Medicines:                Monitored Anesthesia Care Procedure:                Pre-Anesthesia Assessment:                           - Prior to the procedure, a History and Physical                            was performed, and patient medications and                            allergies were reviewed. The patient's tolerance of                            previous anesthesia was also reviewed. The risks                            and benefits of the procedure and the sedation                            options and risks were discussed with the patient.                            All questions were answered, and informed consent                            was obtained. Prior Anticoagulants: The patient has                            taken no previous anticoagulant or antiplatelet                            agents. ASA Grade Assessment: II - A patient with                            mild systemic disease. After reviewing the risks                            and benefits, the patient was deemed in                            satisfactory condition to undergo the procedure.  After obtaining informed consent, the endoscope was                            passed under direct vision. Throughout the                            procedure, the patient's blood pressure, pulse, and                            oxygen saturations were monitored continuously. The                            Endoscope was introduced through the mouth, and                            advanced to the second  part of duodenum. The upper                            GI endoscopy was accomplished without difficulty.                            The patient tolerated the procedure well. Scope In: Scope Out: Findings:                 Esophagogastric landmarks were identified: the                            Z-line was found at 38 cm, the gastroesophageal                            junction was found at 38 cm and the upper extent of                            the gastric folds was found at 40 cm from the                            incisors.                           A 2 cm hiatal hernia was present.                           The exam of the esophagus was otherwise normal.                           Patchy mildly erythematous mucosa was found in the                            gastric antrum. Biopsies were taken with a cold                            forceps from the antrum, body, and incisura for  Helicobacter pylori testing.                           The exam of the stomach was otherwise normal.                           The duodenal bulb and second portion of the                            duodenum were normal. Complications:            No immediate complications. Estimated blood loss:                            Minimal. Estimated Blood Loss:     Estimated blood loss was minimal. Impression:               - Esophagogastric landmarks identified.                           - 2 cm hiatal hernia.                           - Normal esophagus otherwise                           - Erythematous mucosa in the antrum. Biopsied to                            rule out H pylori.                           - Normal duodenal bulb and second portion of the                            duodenum. Recommendation:           - Patient has a contact number available for                            emergencies. The signs and symptoms of potential                            delayed complications were  discussed with the                            patient. Return to normal activities tomorrow.                            Written discharge instructions were provided to the                            patient.                           - Resume previous diet.                           -  Continue present medications.                           - Continue trial of omeprazole 20mg  twice daily for                            1 month to see if this continues to prevent symptoms                           - Avoid NSAIDs                           - Await pathology results.                           - If symptoms persist despite a trial of                            omeprazole, please contact us to discuss additional                            evaluation, to include imaging of the abdomen Viviann Spare P. Armbruster MD, MD 02/08/2017 10:17:21 AM This report has been signed electronically.

## 2017-02-08 NOTE — Progress Notes (Signed)
A/ox3, pleased with MAC, report to RN 

## 2017-02-09 ENCOUNTER — Telehealth: Payer: Self-pay

## 2017-02-09 ENCOUNTER — Other Ambulatory Visit (HOSPITAL_COMMUNITY): Payer: Self-pay | Admitting: *Deleted

## 2017-02-09 DIAGNOSIS — N6320 Unspecified lump in the left breast, unspecified quadrant: Secondary | ICD-10-CM

## 2017-02-09 DIAGNOSIS — R229 Localized swelling, mass and lump, unspecified: Principal | ICD-10-CM

## 2017-02-09 DIAGNOSIS — IMO0002 Reserved for concepts with insufficient information to code with codable children: Secondary | ICD-10-CM

## 2017-02-09 NOTE — Telephone Encounter (Signed)
  Follow up Call-  Call back number 02/08/2017  Post procedure Call Back phone  # [phone]  Permission to leave phone message Yes  Some recent data might be hidden     Patient questions:  Do you have a fever, pain , or abdominal swelling? No. Pain Score  0 *  Have you tolerated food without any problems? Not eating much, feels a little nauseous  Have you been able to return to your normal activities? Yes.    Do you have any questions about your discharge instructions: Diet   No. Medications  No. Follow up visit  No.  Do you have questions or concerns about your Care? No.  Actions: * If pain score is 4 or above: No action needed, pain <4.  Told patient to try and eat something bland this morning and to call back if the nausea didn't go away or if she felt worse.

## 2017-02-10 ENCOUNTER — Other Ambulatory Visit: Payer: Self-pay

## 2017-02-10 ENCOUNTER — Telehealth: Payer: Self-pay | Admitting: Gastroenterology

## 2017-02-10 MED ORDER — ONDANSETRON HCL 4 MG PO TABS: 4.0000 mg | ORAL_TABLET | Freq: Three times a day (TID) | ORAL | 0 refills | Status: DC | PRN

## 2017-02-10 NOTE — Telephone Encounter (Signed)
Sorry to hear her throat is sore. Please reassure her this usually resolves within a few days after the procedure, I expect it continue to improve. She can use throat lozenges to help minimize symptoms. If she is nauseated we can also give her some zofran 4mg  every 8 hours to use (#20) to see if that helps if not already taking. Thanks. I will await pathology results from her EGD and she will be contacted with that when available.

## 2017-02-10 NOTE — Telephone Encounter (Signed)
Patient states she is still having some nausea, is taking the omeprazole. She feels like on the "leftside" of her throat it is more swollen. She is able to eat and drink without difficulty. She states she is having abdominal pain, located mid-left side. She is moving her bowels, states stool is organgish in color. Patient does have visit with a new PCP this Friday to establish care. Patient admits she is very anxious. Advised to drink cold liquids/ice to help with any edema, also encouraged to redirect her anxiety, patient states she may take a walk. Please advise.

## 2017-02-10 NOTE — Telephone Encounter (Signed)
Patient advised of recommendations and [facility]

## 2017-02-12 ENCOUNTER — Encounter: Payer: Self-pay | Admitting: Physician Assistant

## 2017-02-12 ENCOUNTER — Encounter: Payer: Self-pay | Admitting: Gastroenterology

## 2017-02-12 ENCOUNTER — Ambulatory Visit (INDEPENDENT_AMBULATORY_CARE_PROVIDER_SITE_OTHER): Payer: PRIVATE HEALTH INSURANCE | Admitting: Physician Assistant

## 2017-02-12 VITALS — BP 118/80 | HR 94 | Temp 99.1°F | Ht 65.25 in | Wt 262.2 lb

## 2017-02-12 DIAGNOSIS — K219 Gastro-esophageal reflux disease without esophagitis: Secondary | ICD-10-CM

## 2017-02-12 DIAGNOSIS — Z833 Family history of diabetes mellitus: Secondary | ICD-10-CM | POA: Diagnosis not present

## 2017-02-12 DIAGNOSIS — Z0000 Encounter for general adult medical examination without abnormal findings: Secondary | ICD-10-CM

## 2017-02-12 DIAGNOSIS — F418 Other specified anxiety disorders: Secondary | ICD-10-CM

## 2017-02-12 DIAGNOSIS — E669 Obesity, unspecified: Secondary | ICD-10-CM | POA: Diagnosis not present

## 2017-02-12 LAB — CBC WITH DIFFERENTIAL/PLATELET
BASOS PCT: 0.3 % (ref 0.0–3.0)
Basophils Absolute: 0 10*3/uL (ref 0.0–0.1)
Eosinophils Absolute: 0 10*3/uL (ref 0.0–0.7)
Eosinophils Relative: 0.4 % (ref 0.0–5.0)
HEMATOCRIT: 42.2 % (ref 36.0–46.0)
HEMOGLOBIN: 13.6 g/dL (ref 12.0–15.0)
LYMPHS PCT: 20.3 % (ref 12.0–46.0)
Lymphs Abs: 1.5 10*3/uL (ref 0.7–4.0)
MCHC: 32.4 g/dL (ref 30.0–36.0)
MCV: 80.2 fl (ref 78.0–100.0)
MONOS PCT: 6.3 % (ref 3.0–12.0)
Monocytes Absolute: 0.5 10*3/uL (ref 0.1–1.0)
NEUTROS ABS: 5.5 10*3/uL (ref 1.4–7.7)
Neutrophils Relative %: 72.7 % (ref 43.0–77.0)
PLATELETS: 433 10*3/uL — AB (ref 150.0–400.0)
RBC: 5.26 Mil/uL — ABNORMAL HIGH (ref 3.87–5.11)
RDW: 14.9 % (ref 11.5–15.5)
WBC: 7.5 10*3/uL (ref 4.0–10.5)

## 2017-02-12 LAB — TSH: TSH: 1.12 u[IU]/mL (ref 0.35–4.50)

## 2017-02-12 LAB — COMPREHENSIVE METABOLIC PANEL
ALBUMIN: 4.5 g/dL (ref 3.5–5.2)
ALT: 16 U/L (ref 0–35)
AST: 12 U/L (ref 0–37)
Alkaline Phosphatase: 90 U/L (ref 39–117)
BUN: 10 mg/dL (ref 6–23)
CALCIUM: 10.3 mg/dL (ref 8.4–10.5)
CHLORIDE: 99 meq/L (ref 96–112)
CO2: 30 meq/L (ref 19–32)
Creatinine, Ser: 0.86 mg/dL (ref 0.40–1.20)
GFR: 94.82 mL/min (ref >60.00)
Glucose, Bld: 106 mg/dL — ABNORMAL HIGH (ref 70–99)
Potassium: 4.2 mEq/L (ref 3.5–5.1)
Sodium: 138 mEq/L (ref 135–145)
Total Bilirubin: 0.4 mg/dL (ref 0.2–1.2)
Total Protein: 7.6 g/dL (ref 6.0–8.3)

## 2017-02-12 LAB — POCT URINE PREGNANCY: Preg Test, Ur: NEGATIVE

## 2017-02-12 LAB — HEMOGLOBIN A1C: HEMOGLOBIN A1C: 5.9 % (ref 4.6–6.5)

## 2017-02-12 NOTE — Progress Notes (Signed)
Crystal Salinas is a 36 y.o. female here to Establish Care.  I acted as a Neurosurgeonscribe for Energy East CorporationSamantha Izack Hoogland, PA-C Corky Mullonna Orphanos, LPN  History of Present Illness:   Chief Complaint  Patient presents with  . Establish Care    Self pay  . Anxiety    Acute Concerns: She presents today with vague complaints. 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 She has taken melatonin in the past without relief. She had an EGD for epigastric abdominal pain on 02/08/17 and is awaiting results, she was prescribed Prilosec and is taking as prescribed. Testing for H. Pylori and biopsies are still pending. She tells me that she is "very afraid of dying." she states over and over that she is very anxious. 2 years ago a family member (husband's father) died from a massive heart attack and then husband's mother died from OD. She and her husband then took on the custody of the two kids after he passed. "Full-work up" of labs or physical exam hasn't been done in at least 2 years.  Friend recently diagnosed with breast cancer.  Family hx of diabetes. She wants to make sure that there is nothing serious going on with her health. She does endorse a history of anxiety. This has never been treated with medication. She has seen a Veterinary surgeoncounselor via [facility]. She endorses some nonspecific fatigue, headaches, myalgias.When I asked patient what she wanted out of today's visit, she reports several times "I just want to find out what is wrong."  She went to the ED on 10/4 and 10/9 with chest pain --  Was determined to have noncardiac related symptoms at that time, which is why she has been seeing GI.   Patient's last menstrual period was 02/03/2017. IUD in place.  Health Maintenance: Immunizations -- up to date Colonoscopy -- n/a Mammogram -- due for repeat mammo in December, reports that she had an issue with one of her  ducts and needs f/u mammo PAP -- performed at health department, requesting records Weight -- Weight: 262 lb 4 oz (119 kg) --> highest is 281 lb; 250 lb is more "normal weight" for her, last weighed this 5 years ago  Dep[facility] 02/12/2017  Decreased Interest 0  Down, Depressed, Hopeless 0  PHQ - 2 Score 0    GAD 7 : Generalized Anxiety Score 02/12/2017  Nervous, Anxious, on Edge 1  Control/stop worrying 3  Worry too much - different things 3  Trouble relaxing 1  Restless 0  Easily annoyed or irritable 0  Afraid - awful might happen 3  Total GAD 7 Score 11   Other providers/specialists: Hyacinth MeekerJennifer Lemmon - GI  Past Medical History:  Diagnosis Date  . Anxiety   . Breast mass    left breast mass  . HSV (herpes simplex virus) anogenital infection   . No pertinent past medical history      Social History   Socioeconomic History  . Marital status: Single    Spouse name: Not on file  . Number of children: Not on file  . Years of education: Not on file  . Highest education level: Not on file  Social Needs  . Financial resource strain: Not on file  . Food insecurity - worry: Not on file  . Food insecurity - inability: Not on file  . Transportation needs -  medical: Not on file  . Transportation needs - non-medical: Not on file  Occupational History  . Not on file  Tobacco Use  . Smoking status: Never Smoker  . Smokeless tobacco: Never Used  Substance and Sexual Activity  . Alcohol use: No  . Drug use: No  . Sexual activity: Yes    Birth control/protection: IUD    Comment: Mirena  Other Topics Concern  . 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 tubes in ears Left 1990    History reviewed. No pertinent family history.  No Known Allergies   Current Medications:   Current Outpatient Medications:  Marland Kitchen.  Magnesium 250 MG TABS, Take 1 tablet every other day by mouth., Disp: , Rfl:  .  Omega-3 Fatty Acids (OMEGA-3 FISH OIL PO), Take 1 capsule daily by mouth., Disp: , Rfl:  .  omeprazole (PRILOSEC) 20 MG capsule, Take 1 capsule (20 mg total) by mouth 2 (two) times daily before a meal., Disp: 60 capsule, Rfl: 3 .  POTASSIUM CHLORIDE PO, Take 1 tablet every other day by mouth., Disp: , Rfl:  .  diphenhydramine-acetaminophen (TYLENOL PM) 25-500 MG TABS tablet, Take 1 tablet by mouth at bedtime as needed., Disp: , Rfl:  .  ondansetron (ZOFRAN) 4 MG tablet, Take 1 tablet (4 mg total) every 8 (eight) hours as needed by mouth for nausea or vomiting. (Patient not taking: Reported on 02/12/2017), Disp: 20 tablet, Rfl: 0 .  Phenylephrine-DM-GG-APAP (SUDAFED PE COLD/COUGH PO), Take 2 tablets by mouth daily as needed., Disp: , Rfl:   Current Facility-Administered Medications:  .  0.9 %  sodium chloride infusion, 500 mL, Intravenous, Continuous, Armbruster, Willaim RayasSteven P, MD   Review of Systems:   ROS  Negative unless otherwise specified per history of present illness  Vitals:   Vitals:   02/12/17 0939  BP: 118/80  Pulse: 94  Temp: 99.1 F (37.3 C)  TempSrc: Oral  SpO2: 98%  Weight: 262 lb 4 oz (119 kg)  Height: 5' 5.25" (1.657 m)     Body mass index is 43.31 kg/m.  Physical Exam:   Physical Exam  Constitutional: She appears well-developed. She is cooperative.  Non-toxic appearance. She does not have a sickly appearance. She does not appear ill. No distress.  Cardiovascular: Normal rate, regular rhythm, S1 normal, S2 normal, normal heart sounds and normal pulses.  No LE edema  Pulmonary/Chest: Effort normal and breath sounds normal.  Neurological: She is alert. GCS eye subscore is 4. GCS verbal  subscore is 5. GCS motor subscore is 6.  Skin: Skin is warm, dry and intact.  Psychiatric: She has a normal mood and affect. Her speech is normal and behavior is normal.  pleasant  Nursing note and vitals reviewed.   Assessment and Plan:    Crystal Salinas was seen today for establish care and anxiety.  Diagnoses and all orders for this visit:  Encounter for medical examination to establish care I recommended that she follow-up with us for a physical exam so we can discuss routine health maintenance.  Gastroesophageal reflux  disease, esophagitis presence not specified Continue Prilosec per GI orders. Follow-up with GI as needed. Still awaiting results from EGD. -     POCT urine pregnancy  Anxiety about health We had a long discussion about this. She is not agreeable to starting medication today. However she is going to think about starting Lexapro. She did ask me about starting a benzo, however I discussed the risks and benefits of these medications and I currently believe that the risks outweigh the benefits and I'm not comfortable starting this prescription for her at this time. Patient is agreeable to plan. I will get some routine testing, however I also discussed with patient that I do not believe that ordering numerous labs would be beneficial at this time. I encouraged patient to continue to go see her EAP counselor. I discussed with patient that if they develop any SI, to tell someone immediately and seek medical attention. She does have several responsibilities at home and at work, discussed taking time for herself. -     CBC with Differential/Platelet -     Comprehensive metabolic panel -     POCT urine pregnancy -     TSH  Family history of diabetes mellitus and Obesity, unspecified classification, unspecified obesity type, unspecified whether serious comorbidity present I would like to check a hemoglobin A1c and TSH given family history of diabetes and obesity. Further workup at  [facility]. -     Hemoglobin A1c -     TSH  . Reviewed expectations re: course of current medical issues. . Discussed self-management of symptoms. . Outlined signs and symptoms indicating need for more acute intervention. . Patient verbalized understanding and all questions were answered. . See orders for this visit as documented in the electronic medical record. . Patient received an After-Visit Summary.   CMA or LPN served as scribe during this visit. History, Physical, and Plan performed by medical provider. Documentation and orders reviewed and attested to.   Jarold Motto, PA-C

## 2017-02-12 NOTE — Patient Instructions (Signed)
It was great to meet you.  We will call you with lab results.  Try to take at least 15-30 minutes every day for yourself! Keep the visits with the EAP counselor.  Consider Lexapro (below is the information on this) Escitalopram tablets What is this medicine? ESCITALOPRAM (es sye TAL oh pram) is used to treat depression and certain types of anxiety. This medicine may be used for other purposes; ask your health care provider or pharmacist if you have questions. COMMON BRAND NAME(S): Lexapro What should I tell my health care provider before I take this medicine? They need to know if you have any of these conditions: -bipolar disorder or a family history of bipolar disorder -diabetes -glaucoma -heart disease -kidney or liver disease -receiving electroconvulsive therapy -seizures (convulsions) -suicidal thoughts, plans, or attempt by you or a family member -an unusual or allergic reaction to escitalopram, the related drug citalopram, other medicines, foods, dyes, or preservatives -pregnant or trying to become pregnant -breast-feeding How should I use this medicine? Take this medicine by mouth with a glass of water. Follow the directions on the prescription label. You can take it with or without food. If it upsets your stomach, take it with food. Take your medicine at regular intervals. Do not take it more often than directed. Do not stop taking this medicine suddenly except upon the advice of your doctor. Stopping this medicine too quickly may cause serious side effects or your condition may worsen. A special MedGuide will be given to you by the pharmacist with each prescription and refill. Be sure to read this information carefully each time. Talk to your pediatrician regarding the use of this medicine in children. Special care may be needed. Overdosage: If you think you have taken too much of this medicine contact a poison control center or emergency room at once. NOTE: This medicine is  only for you. Do not share this medicine with others. What if I miss a dose? If you miss a dose, take it as soon as you can. If it is almost time for your next dose, take only that dose. Do not take double or extra doses. What may interact with this medicine? Do not take this medicine with any of the following medications: -certain medicines for fungal infections like fluconazole, itraconazole, ketoconazole, posaconazole, voriconazole -cisapride -citalopram -dofetilide -dronedarone -linezolid -MAOIs like Carbex, Eldepryl, Marplan, Nardil, and Parnate -methylene blue (injected into a vein) -pimozide -thioridazine -ziprasidone This medicine may also interact with the following medications: -alcohol -amphetamines -aspirin and aspirin-like medicines -carbamazepine -certain medicines for depression, anxiety, or psychotic disturbances -certain medicines for migraine headache like almotriptan, eletriptan, frovatriptan, naratriptan, rizatriptan, sumatriptan, zolmitriptan -certain medicines for sleep -certain medicines that treat or prevent blood clots like warfarin, enoxaparin, dalteparin -cimetidine -diuretics -fentanyl -furazolidone -isoniazid -lithium -metoprolol -NSAIDs, medicines for pain and inflammation, like ibuprofen or naproxen -other medicines that prolong the QT interval (cause an abnormal heart rhythm) -procarbazine -rasagiline -supplements like St. John's wort, kava kava, valerian -tramadol -tryptophan This list may not describe all possible interactions. Give your health care provider a list of all the medicines, herbs, non-prescription drugs, or dietary supplements you use. Also tell them if you smoke, drink alcohol, or use illegal drugs. Some items may interact with your medicine. What should I watch for while using this medicine? Tell your doctor if your symptoms do not get better or if they get worse. Visit your doctor or health care professional for regular checks  on your progress. Because it may take  several weeks to see the full effects of this medicine, it is important to continue your treatment as prescribed by your doctor. Patients and their families should watch out for new or worsening thoughts of suicide or depression. Also watch out for sudden changes in feelings such as feeling anxious, agitated, panicky, irritable, hostile, aggressive, impulsive, severely restless, overly excited and hyperactive, or not being able to sleep. If this happens, especially at the beginning of treatment or after a change in dose, call your health care professional. Bonita QuinYou may get drowsy or dizzy. Do not drive, use machinery, or do anything that needs mental alertness until you know how this medicine affects you. Do not stand or sit up quickly, especially if you are an older patient. This reduces the risk of dizzy or fainting spells. Alcohol may interfere with the effect of this medicine. Avoid alcoholic drinks. Your mouth may get dry. Chewing sugarless gum or sucking hard candy, and drinking plenty of water may help. Contact your doctor if the problem does not go away or is severe. What side effects may I notice from receiving this medicine? Side effects that you should report to your doctor or health care professional as soon as possible: -allergic reactions like skin rash, itching or hives, swelling of the face, lips, or tongue -anxious -black, tarry stools -changes in vision -confusion -elevated mood, decreased need for sleep, racing thoughts, impulsive behavior -eye pain -fast, irregular heartbeat -feeling faint or lightheaded, falls -feeling agitated, angry, or irritable -hallucination, loss of contact with reality -loss of balance or coordination -loss of memory -painful or prolonged erections -restlessness, pacing, inability to keep still -seizures -stiff muscles -suicidal thoughts or other mood changes -trouble sleeping -unusual bleeding or  bruising -unusually weak or tired -vomiting Side effects that usually do not require medical attention (report to your doctor or health care professional if they continue or are bothersome): -changes in appetite -change in sex drive or performance -headache -increased sweating -indigestion, nausea -tremors This list may not describe all possible side effects. Call your doctor for medical advice about side effects. You may report side effects to FDA at 1-800-FDA-1088. Where should I keep my medicine? Keep out of reach of children. Store at room temperature between 15 and 30 degrees C (59 and 86 degrees F). Throw away any unused medicine after the expiration date. NOTE: This sheet is a summary. It may not cover all possible information. If you have questions about this medicine, talk to your doctor, pharmacist, or health care provider.  2018 Elsevier/Gold Standard (2015-08-26 13:20:23)

## 2017-02-16 ENCOUNTER — Telehealth: Payer: Self-pay | Admitting: Physician Assistant

## 2017-02-16 ENCOUNTER — Encounter (HOSPITAL_COMMUNITY): Payer: Self-pay

## 2017-02-16 NOTE — Telephone Encounter (Signed)
Please see message and advise 

## 2017-02-16 NOTE — Telephone Encounter (Signed)
Patient wants to try a more natural approach for anxiety instead of Lexapro.  Please advise, Ty.  -LL

## 2017-02-16 NOTE — Telephone Encounter (Signed)
Spoke to pt, told her Lelon MastSamantha said non-medication anxiety treatments may include: talk therapy, yoga, aromatherapy, and exercise. Needs follow-up if she wants to discuss further. Pt verbalized understanding.

## 2017-02-16 NOTE — Telephone Encounter (Signed)
Please call patient and advise her that non-medication anxiety treatments may include: talk therapy, yoga, aromatherapy, and exercise.  Needs follow-up if she wants to discuss further.  Jarold MottoSamantha Dionisia Pacholski PA-C 02/16/17

## 2017-02-18 ENCOUNTER — Telehealth: Payer: Self-pay | Admitting: Physician Assistant

## 2017-02-18 ENCOUNTER — Encounter: Payer: Self-pay | Admitting: Physician Assistant

## 2017-02-18 NOTE — Telephone Encounter (Signed)
Patient called concerned that the only lab results that were in her mychart was her pregnancy test. Call patient to advise. I did make her aware that Lelon MastSamantha was out today and her clinical staff was gone for the day. Okay to leave a detailed message.

## 2017-02-19 ENCOUNTER — Ambulatory Visit (INDEPENDENT_AMBULATORY_CARE_PROVIDER_SITE_OTHER): Payer: PRIVATE HEALTH INSURANCE | Admitting: Physician Assistant

## 2017-02-19 ENCOUNTER — Encounter: Payer: Self-pay | Admitting: Physician Assistant

## 2017-02-19 VITALS — BP 120/80 | HR 87 | Temp 98.6°F | Ht 65.25 in | Wt 261.0 lb

## 2017-02-19 DIAGNOSIS — F418 Other specified anxiety disorders: Secondary | ICD-10-CM

## 2017-02-19 DIAGNOSIS — R0789 Other chest pain: Secondary | ICD-10-CM

## 2017-02-19 MED ORDER — ESCITALOPRAM OXALATE 10 MG PO TABS: 10.0000 mg | ORAL_TABLET | Freq: Every day | ORAL | 1 refills | Status: DC

## 2017-02-19 MED ORDER — HYDROXYZINE HCL 25 MG PO TABS: 25.0000 mg | ORAL_TABLET | Freq: Every evening | ORAL | 0 refills | Status: DC | PRN

## 2017-02-19 NOTE — Progress Notes (Signed)
Crystal Salinas is a 36 y.o. female here for a follow up of a pre-existing problem.  I acted as a Neurosurgeonscribe for [employer] CorporationSamantha Budd Fre[place], PA-C Corky Mullonna Orphanos, LPN  History of Present Illness:   Chief Complaint  Patient presents with  . Anxiety    Anxiety  Presents for initial visit. Episode onset: Pt having anxiety issue past few weeks off and on. The problem has been gradually worsening. Symptoms include excessive worry, insomnia, nervous/anxious behavior, palpitations, panic and restlessness. Patient reports no chest pain, depressed mood, dizziness, shortness of breath or suicidal ideas. Duration: less than one minnute. The symptoms are aggravated by family issues, social activities and work stress. Hours of sleep per night: 5 to 7  The quality of sleep is fair. Nighttime awakenings: one to two.   Past treatments include herbal remedies. The treatment provided mild relief.   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 She has not been able to get over this episode and keeps replaying it in her mind. She reports that the patient did not suffer significant health issues from the fall but for whatever reason she cannot stop thinking about the fall and blaming herself. She has a pharmacy friend that has suggested Lexapro to her and she states that she is now willing to try this.  She has a lot of health anxiety about her heart. She has felt palpitations at night, most recently yesterday. She is also here to make sure she is not having a "heart attack." She was prescribed Omeprazole by her GI doctor but reports that she has not been taking it because of cost.  GAD 7 : Generalized Anxiety Score 02/19/2017 02/12/2017  Nervous, Anxious, on Edge 3 1  Control/stop worrying 3 3  Worry too much - different things 3 3  Trouble relaxing 3 1  Restless 0 0   Easily annoyed or irritable 0 0  Afraid - awful might happen 3 3  Total GAD 7 Score 15 11  Anxiety Difficulty Somewhat difficult -      Past Medical History:  Diagnosis Date  . Anxiety   . Breast mass    left breast mass  . HSV (herpes simplex virus) anogenital infection   . No pertinent past medical history      Social History   Socioeconomic History  . Marital status: Single    Spouse name: Not on file  . Number of children: Not on file  . Years of education: Not on file  . Highest education level: Not on file  Social Needs  . Financial resource strain: Not on file  . Food insecurity - worry: Not on file  . Food insecurity - inability: Not on file  . Transportation needs - medical: Not on file  . Transportation needs - non-medical: Not on file  Occupational History  . Not on file  Tobacco Use  . Smoking status: Never Smoker  . Smokeless tobacco: Never Used  Substance and Sexual Activity  . Alcohol use: No  . Drug use: No  . Sexual activity: Yes    Birth control/protection: IUD    Comment: Mirena  Other Topics Concern  . 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 tubes in ears Left 1990    History reviewed. No pertinent family history.  No Known Allergies  Current Medications:   Current Outpatient Medications:  .  diphenhydramine-acetaminophen (TYLENOL PM) 25-500 MG TABS tablet, Take 1 tablet by mouth at bedtime as needed., Disp: , Rfl:  .  Magnesium 250 MG TABS, Take 1 tablet every other day by mouth., Disp: , Rfl:  .  Omega-3 Fatty Acids (OMEGA-3 FISH OIL PO), Take 1 capsule daily by mouth., Disp: , Rfl:   .  omeprazole (PRILOSEC) 20 MG capsule, Take 1 capsule (20 mg total) by mouth 2 (two) times daily before a meal., Disp: 60 capsule, Rfl: 3 .  ondansetron (ZOFRAN) 4 MG tablet, Take 1 tablet (4 mg total) every 8 (eight) hours as needed by mouth for nausea or vomiting., Disp: 20 tablet, Rfl: 0 .  Phenylephrine-DM-GG-APAP (SUDAFED PE COLD/COUGH PO), Take 2 tablets by mouth daily as needed., Disp: , Rfl:  .  POTASSIUM CHLORIDE PO, Take 1 tablet every other day by mouth., Disp: , Rfl:  .  escitalopram (LEXAPRO) 10 MG tablet, Take 1 tablet (10 mg total) daily by mouth., Disp: 30 tablet, Rfl: 1 .  hydrOXYzine (ATARAX/VISTARIL) 25 MG tablet, Take 1 tablet (25 mg total) at bedtime as needed by mouth for anxiety (insomnia). Take one 25 mg tablet 30-60 minutes prior to bedtime for insomnia, anxiety. May increase to two tablets., Disp: 60 tablet, Rfl: 0  Current Facility-Administered Medications:  .  0.9 %  sodium chloride infusion, 500 mL, Intravenous, Continuous, Armbruster, Willaim RayasSteven P, MD   Review of Systems:   Review of Systems  Constitutional: Negative for chills, fever, malaise/fatigue and weight loss.  Respiratory: Negative for cough, hemoptysis and shortness of breath.   Cardiovascular: Positive for palpitations. Negative for chest pain.  Gastrointestinal: Positive for heartburn.  Musculoskeletal: Negative for myalgias.  Neurological: Positive for tingling. Negative for dizziness and headaches.  Psychiatric/Behavioral: Negative for depression and suicidal ideas. The patient is nervous/anxious and has insomnia.     Vitals:   Vitals:   02/19/17 1045  BP: 120/80  Pulse: 87  Temp: 98.6 F (37 C)  TempSrc: Oral  SpO2: 97%  Weight: 261 lb (118.4 kg)  Height: 5' 5.25" (1.657 m)     Body mass index is 43.1 kg/m.  Physical Exam:   Physical Exam  Constitutional: She appears well-developed. She is cooperative.  Non-toxic appearance. She does not have a sickly appearance. She does not appear  ill. No distress.  Cardiovascular: Normal rate, regular rhythm, S1 normal, S2 normal, normal heart sounds and normal pulses.  No LE edema  Pulmonary/Chest: Effort normal and breath sounds normal.  Neurological: She is alert. GCS eye subscore is 4. GCS verbal subscore is 5. GCS motor subscore is 6.  Skin: Skin is warm, dry and intact.  Psychiatric: Her speech is normal and behavior is normal. Her mood appears anxious.  Anxious throughout exam  Nursing note and vitals reviewed.  EKG tracing is personally reviewed.  EKG notes NSR.  No acute changes.   Assessment and Plan:    [name] was seen today for anxiety.  Diagnoses and all orders for this visit:  Chest tightness Several labs completed last  Friday, no indication to repeat labs at this time. Patient requested EKG for reassurance. EKG without significant changes. No exertional symptoms. I have offered her a visit to cardiology for a second opinion, especially since palpitations were not captured on today's exam, and she is interested in possible Holter monitor for this. Advised that if she develop worsening chest pain that she go to the ED. -     EKG 12-Lead  Anxiety about health She is finally agreeable to start Lexapro 10 mg today. GAD-7 was 11 last week and is now 15. I recommended that she continue seeing her therapist. Follow-up in 4-6 weeks to assess medication efficacy, sooner if needed.   Other orders -     escitalopram (LEXAPRO) 10 MG tablet; Take 1 tablet (10 mg total) daily by mouth. -     hydrOXYzine (ATARAX/VISTARIL) 25 MG tablet; Take 1 tablet (25 mg total) at bedtime as needed by mouth for anxiety (insomnia). Take one 25 mg tablet 30-60 minutes prior to bedtime for insomnia, anxiety. May increase to two tablets.    . Reviewed expectations re: course of current medical issues. . Discussed self-management of symptoms. . Outlined signs and symptoms indicating need for more acute intervention. . Patient verbalized  understanding and all questions were answered. . See orders for this visit as documented in the electronic medical record. . Patient received an After-Visit Summary.  CMA or LPN served as scribe during this visit. History, Physical, and Plan performed by medical provider. Documentation and orders reviewed and attested to.  Jarold Motto, PA-C

## 2017-02-19 NOTE — Patient Instructions (Signed)
It was great to see you.  [facility] or [facility] to see if your prescriptions can be cheaper there.  We will send you to cardiology for further evaluation for reassurance.

## 2017-02-19 NOTE — Telephone Encounter (Signed)
Pt is able to see labs, all released thru My Chart. Pt is talking with Lelon MastSamantha thru My Chart.

## 2017-02-21 ENCOUNTER — Emergency Department (HOSPITAL_COMMUNITY)
Admission: EM | Admit: 2017-02-21 | Discharge: 2017-02-21 | Disposition: A | Discharge status: Home / Self Care | Payer: PRIVATE HEALTH INSURANCE | Attending: Emergency Medicine | Admitting: Emergency Medicine

## 2017-02-21 ENCOUNTER — Encounter (HOSPITAL_COMMUNITY): Payer: Self-pay | Admitting: *Deleted

## 2017-02-21 ENCOUNTER — Encounter: Payer: Self-pay | Admitting: Physician Assistant

## 2017-02-21 ENCOUNTER — Emergency Department (HOSPITAL_COMMUNITY): Payer: PRIVATE HEALTH INSURANCE

## 2017-02-21 ENCOUNTER — Other Ambulatory Visit: Payer: Self-pay

## 2017-02-21 ENCOUNTER — Encounter (HOSPITAL_COMMUNITY): Payer: Self-pay | Admitting: Emergency Medicine

## 2017-02-21 DIAGNOSIS — Z79899 Other long term (current) drug therapy: Secondary | ICD-10-CM | POA: Insufficient documentation

## 2017-02-21 DIAGNOSIS — R202 Paresthesia of skin: Secondary | ICD-10-CM | POA: Insufficient documentation

## 2017-02-21 DIAGNOSIS — F419 Anxiety disorder, unspecified: Secondary | ICD-10-CM | POA: Insufficient documentation

## 2017-02-21 DIAGNOSIS — R002 Palpitations: Secondary | ICD-10-CM | POA: Insufficient documentation

## 2017-02-21 DIAGNOSIS — R1084 Generalized abdominal pain: Secondary | ICD-10-CM | POA: Insufficient documentation

## 2017-02-21 DIAGNOSIS — R0789 Other chest pain: Secondary | ICD-10-CM | POA: Insufficient documentation

## 2017-02-21 LAB — I-STAT TROPONIN, ED: Troponin i, poc: 0 ng/mL (ref 0.00–0.08)

## 2017-02-21 LAB — BASIC METABOLIC PANEL
ANION GAP: 9 (ref 5–15)
BUN: 7 mg/dL (ref 6–20)
CALCIUM: 9.7 mg/dL (ref 8.9–10.3)
CO2: 23 mmol/L (ref 22–32)
CREATININE: 0.77 mg/dL (ref 0.44–1.00)
Chloride: 105 mmol/L (ref 101–111)
GFR calc non Af Amer: >60 mL/min (ref >60)
Glucose, Bld: 106 mg/dL — ABNORMAL HIGH (ref 65–99)
Potassium: 3.9 mmol/L (ref 3.5–5.1)
SODIUM: 137 mmol/L (ref 135–145)

## 2017-02-21 LAB — CBC
HCT: 40.3 % (ref 36.0–46.0)
Hemoglobin: 13.1 g/dL (ref 12.0–15.0)
MCH: 25.6 pg — AB (ref 26.0–34.0)
MCHC: 32.5 g/dL (ref 30.0–36.0)
MCV: 78.9 fL (ref 78.0–100.0)
PLATELETS: 412 10*3/uL — AB (ref 150–400)
RBC: 5.11 MIL/uL (ref 3.87–5.11)
RDW: 14 % (ref 11.5–15.5)
WBC: 6.5 10*3/uL (ref 4.0–10.5)

## 2017-02-21 LAB — I-STAT BETA HCG BLOOD, ED (MC, WL, AP ONLY): I-stat hCG, quantitative: <5 m[IU]/mL (ref <5)

## 2017-02-21 NOTE — ED Triage Notes (Signed)
Was seen earlier today for CP.  Reports being told it is anxiety.  Now c/o RLQ, LLQ abdominal pain that radiates to right lower back.  Continues to c/o arms tingling.  Noted to be anxious in triage.

## 2017-02-21 NOTE — ED Notes (Signed)
Pt requesting to see MD again stating "I can't sleep or go to work with all this prickling feeling".

## 2017-02-21 NOTE — ED Notes (Signed)
Pt stable,ambulatory, and verbalizes understanding of D/C instructions.   

## 2017-02-21 NOTE — ED Triage Notes (Signed)
Pt states has been worked up multiple times for bil arm and leg tingling.  All Dr's tell it is anxiety.  PT has only taken 2 pills prescribed for anxiety.

## 2017-02-21 NOTE — ED Provider Notes (Addendum)
[facility] Provider Note   CSN: [id number] Arrival date & time: 02/21/17  [id number]     History   Chief Complaint Chief Complaint  Patient presents with  . Panic Attack    HPI Nita SickleLatoya J Foote is a 36 y.o. female.  HPI  36 year old female presents today complaining of burning of her bilateral upper extremities that radiates across her back in her chest present for the past 6 weeks.  She has been seen multiple times in the ED's, by her primary care physician, and was referred to follow-up with gastroenterology.  States that she had EKGs and chest x-rays with cardiac enzymes done at any pen and [facility].  I [facility] with normal lab work, chest x-Wetona Viramontes, EKG and again on 10 9 [facility] at [facility].  In the interim, she has had gastroenterology workup with upper endoscopy.  She reports that she had some gastritis and was told that she had some reflux and had her Protonix increased.  She was awoken last night with palpitations in the middle the night.  She can also continues to complain of burning in her arms.  States some of the pain is in her upper back and she is also concerned that it could be her gallbladder.  She reports no nausea or vomiting postprandial.  She has not had any decreased appetite or weight loss.  Past Medical History:  Diagnosis Date  . Anxiety   . Breast mass    left breast mass  . HSV (herpes simplex virus) anogenital infection   . No pertinent past medical history     Patient Active Problem List   Diagnosis Date Noted  . POLYCYSTIC OVARIAN DISEASE 08/19/2009  . OBESITY 08/19/2009    Past Surgical History:  Procedure Laterality Date  . tubes in ears Left 1990    OB History    Gravida Para Term Preterm AB Living   3 3 3     3    SAB TAB Ectopic Multiple Live Births           1       Home Medications    Prior to Admission medications   Medication Sig  Start Date End Date Taking? Authorizing Provider  diphenhydramine-acetaminophen (TYLENOL PM) 25-500 MG TABS tablet Take 1 tablet by mouth at bedtime as needed.    [provider]  escitalopram (LEXAPRO) 10 MG tablet Take 1 tablet (10 mg total) daily by mouth. 02/19/17   Jarold MottoWorley, Samantha, PA  hydrOXYzine (ATARAX/VISTARIL) 25 MG tablet Take 1 tablet (25 mg total) at bedtime as needed by mouth for anxiety (insomnia). Take one 25 mg tablet 30-60 minutes prior to bedtime for insomnia, anxiety. May increase to two tablets. 02/19/17   Jarold MottoWorley, Samantha, PA  Magnesium 250 MG TABS Take 1 tablet every other day by mouth.    [provider]  Omega-3 Fatty Acids (OMEGA-3 FISH OIL PO) Take 1 capsule daily by mouth.    [provider]  omeprazole (PRILOSEC) 20 MG capsule Take 1 capsule (20 mg total) by mouth 2 (two) times daily before a meal. 02/02/17   Lemmon, Violet BaldyJennifer Lynne, PA  ondansetron (ZOFRAN) 4 MG tablet Take 1 tablet (4 mg total) every 8 (eight) hours as needed by mouth for nausea or vomiting. 02/10/17   Armbruster, Willaim RayasSteven P, MD  Phenylephrine-DM-GG-APAP (SUDAFED PE COLD/COUGH PO) Take 2 tablets by mouth daily as needed.  [provider]  POTASSIUM CHLORIDE PO Take 1 tablet every other day by mouth.    [provider]    Family History No family history on file.  Social History Social History   Tobacco Use  . Smoking status: Never Smoker  . Smokeless tobacco: Never Used  Substance Use Topics  . Alcohol use: No  . Drug use: No     Allergies   Patient has no known allergies.   Review of Systems Review of Systems  Musculoskeletal: Positive for back pain and neck pain.  Neurological: Positive for numbness.  All other systems reviewed and are negative.    Physical Exam Updated Vital Signs BP 118/63   Pulse 78   Resp 20   LMP 02/03/2017 Comment: spotting  SpO2 99%   Physical Exam  Constitutional: She is oriented to person, place, and  time. She appears well-developed and well-nourished. She appears distressed.  HENT:  Head: Normocephalic and atraumatic.  Right Ear: External ear normal.  Left Ear: External ear normal.  Nose: Nose normal.  Mouth/Throat: Oropharynx is clear and moist.  Eyes: EOM are normal. Pupils are equal, round, and reactive to light.  Neck: Normal range of motion. Neck supple.  Cardiovascular: Normal rate, regular rhythm, normal heart sounds and intact distal pulses.  Pulmonary/Chest: Effort normal and breath sounds normal.  Abdominal: Soft. Bowel sounds are normal.  Mild epigastric ttp  Musculoskeletal: Normal range of motion. She exhibits no edema or tenderness.  Neurological: She is alert and oriented to person, place, and time.  Skin: Skin is warm and dry. Capillary refill takes less than 2 seconds.  Psychiatric: She has a normal mood and affect.  Vitals reviewed.  Vitals:   02/21/17 1230  BP: 118/63  Pulse: 78  Resp: 20  SpO2: 99%     ED Treatments / Results  Labs (all labs ordered are listed, but only abnormal results are displayed) Labs Reviewed  BASIC METABOLIC PANEL - Abnormal; Notable for the following components:      Result Value   Glucose, Bld 106 (*)    All other components within normal limits  CBC - Abnormal; Notable for the following components:   MCH 25.6 (*)    Platelets 412 (*)    All other components within normal limits  I-STAT TROPONIN, ED  I-STAT BETA HCG BLOOD, ED ([facility], [facility], [facility] ONLY)    EKG  EKG Interpretation  Date/Time:  Sunday February 21 2017 10:28:50 EST Ventricular Rate:  82 PR Interval:  142 QRS Duration: 86 QT Interval:  352 QTC Calculation: 411 R Axis:   75 Text Interpretation:  Normal sinus rhythm Normal ECG Confirmed by Margarita Grizzle [phone]) on 02/21/2017 12:23:35 PM       Radiology Dg Chest 2 View  Result Date: 02/21/2017 CLINICAL DATA:  Bilateral arm and leg tingling onset October 4th. Now burning across chest and back and in both  arms. Squeezing chest pressure at times. History of anxiety. EXAM: CHEST  2 VIEW COMPARISON:  Chest x-Jeslyn Amsler dated 01/12/2017. Chest x-Lucillia Corson dated 07/15/2012. FINDINGS: Heart size and mediastinal contours are stable. Lungs are clear. Lung volumes are normal. No pleural effusion or pneumothorax seen. Osseous structures about the chest are unremarkable. IMPRESSION: No active cardiopulmonary disease. No evidence of pneumonia or pulmonary edema. Electronically Signed   By: Bary Richard M.D.   On: 02/21/2017 10:55    Procedures Procedures (including critical care time)  Medications Ordered in ED Medications - No data to display  Initial Impression / Assessment and Plan / ED Course  I have reviewed the triage vital signs and the nursing notes.  Pertinent labs & imaging results that were available during my care of the patient were reviewed by me and considered in my medical decision making (see chart for details).     36 year old female presents today complaining of paresthesias of her neck, arms and chest.  She has had multiple recent evaluations.  These include upper endoscopy.  She states that she was noted to have some gastritis and has had her medications increased.  Her primary care doctor has given her hydralazine at night for sleep and she is beginning Lexapro.  The first night she took the hydroxizine she did sleep better but took a full dose last night and states that she was up and jittery all night.  I suspect that some of this is a paradoxical reaction to the antihistamine Review of previous workups, today's chest x-Shalandria Elsbernd, EKG, and labs reveal no emergent conditions.  Patient advised to follow-up with primary care physician. Final Clinical Impressions(s) / ED Diagnoses   Final diagnoses:  Paresthesia    ED Discharge Orders    None       Margarita Grizzleay, Nira Visscher, MD 02/21/17 1326    Margarita Grizzleay, Sehar Sedano, MD 02/21/17 [phone]

## 2017-02-21 NOTE — ED Notes (Signed)
ED Provider at bedside. 

## 2017-02-22 ENCOUNTER — Emergency Department (HOSPITAL_COMMUNITY): Payer: Self-pay

## 2017-02-22 ENCOUNTER — Encounter: Payer: Self-pay | Admitting: Physician Assistant

## 2017-02-22 ENCOUNTER — Emergency Department (HOSPITAL_COMMUNITY)
Admission: EM | Admit: 2017-02-22 | Discharge: 2017-02-22 | Disposition: A | Discharge status: Home / Self Care | Payer: Self-pay | Attending: Emergency Medicine | Admitting: Emergency Medicine

## 2017-02-22 DIAGNOSIS — R0789 Other chest pain: Secondary | ICD-10-CM

## 2017-02-22 DIAGNOSIS — R1084 Generalized abdominal pain: Secondary | ICD-10-CM

## 2017-02-22 LAB — CBC
HEMATOCRIT: 41.4 % (ref 36.0–46.0)
Hemoglobin: 13.6 g/dL (ref 12.0–15.0)
MCH: 26.1 pg (ref 26.0–34.0)
MCHC: 32.9 g/dL (ref 30.0–36.0)
MCV: 79.3 fL (ref 78.0–100.0)
Platelets: 404 10*3/uL — ABNORMAL HIGH (ref 150–400)
RBC: 5.22 MIL/uL — ABNORMAL HIGH (ref 3.87–5.11)
RDW: 14.1 % (ref 11.5–15.5)
WBC: 9.6 10*3/uL (ref 4.0–10.5)

## 2017-02-22 LAB — BASIC METABOLIC PANEL
Anion gap: 10 (ref 5–15)
BUN: 9 mg/dL (ref 6–20)
CHLORIDE: 102 mmol/L (ref 101–111)
CO2: 24 mmol/L (ref 22–32)
Calcium: 9.7 mg/dL (ref 8.9–10.3)
Creatinine, Ser: 0.83 mg/dL (ref 0.44–1.00)
GFR calc Af Amer: >60 mL/min (ref >60)
GFR calc non Af Amer: >60 mL/min (ref >60)
GLUCOSE: 104 mg/dL — AB (ref 65–99)
POTASSIUM: 3.7 mmol/L (ref 3.5–5.1)
SODIUM: 136 mmol/L (ref 135–145)

## 2017-02-22 LAB — I-STAT TROPONIN, ED
Troponin i, poc: 0 ng/mL (ref 0.00–0.08)
Troponin i, poc: 0 ng/mL (ref 0.00–0.08)

## 2017-02-22 LAB — HEPATIC FUNCTION PANEL
ALT: 19 U/L (ref 14–54)
AST: 18 U/L (ref 15–41)
Albumin: 4.1 g/dL (ref 3.5–5.0)
Alkaline Phosphatase: 87 U/L (ref 38–126)
Bilirubin, Direct: 0.2 mg/dL (ref 0.1–0.5)
Indirect Bilirubin: 0.4 mg/dL (ref 0.3–0.9)
Total Bilirubin: 0.6 mg/dL (ref 0.3–1.2)
Total Protein: 7.5 g/dL (ref 6.5–8.1)

## 2017-02-22 LAB — D-DIMER, QUANTITATIVE (NOT AT ARMC): D DIMER QUANT: 0.39 ug{FEU}/mL (ref 0.00–0.50)

## 2017-02-22 LAB — LIPASE, BLOOD: Lipase: 29 U/L (ref 11–51)

## 2017-02-22 MED ORDER — LORAZEPAM 0.5 MG PO TABS: 0.5000 mg | ORAL_TABLET | Freq: Every day | ORAL | 0 refills | Status: DC

## 2017-02-22 MED ORDER — SODIUM CHLORIDE 0.9 % IV BOLUS (SEPSIS)
1000.0000 mL | Freq: Once | INTRAVENOUS | Status: AC
  Administered 2017-02-22: 1000 mL via INTRAVENOUS

## 2017-02-22 NOTE — ED Notes (Signed)
Patient ambulated to restroom independently. No acute distress noted.

## 2017-02-22 NOTE — ED Notes (Signed)
Per patient,  No one collected blood work.

## 2017-02-22 NOTE — Discharge Instructions (Signed)
Follow-up with the cardiologist provided. return here as needed.  Your testing today did not show any significant abnormalities

## 2017-02-22 NOTE — ED Provider Notes (Signed)
[facility] EMERGENCY DEPARTMENT Provider Note   CSN: [id number] Arrival date & time: 02/21/17  2111     History   Chief Complaint Chief Complaint  Patient presents with  . Chest Pain  . Abdominal Pain    HPI Crystal Salinas is a 36 y.o. female.  HPI Patient presents to the emergency department with multiple complaints.  These complaints been ongoing for the last 2 months.  The patient states that she been having palpitations especially worse at nighttime along with tingling in her extremities.  The patient states the symptoms seem to come and go.  Patient states she seen her primary doctor in the emergency department several times for these symptoms.  The patient denies shortness of breath, headache,blurred vision, neck pain, fever, cough, weakness, numbness, dizziness, anorexia, edema, abdominal pain, nausea, vomiting, diarrhea, rash, back pain, dysuria, hematemesis, bloody stool, near syncope, or syncope. Past Medical History:  Diagnosis Date  . Anxiety   . Breast mass    left breast mass  . HSV (herpes simplex virus) anogenital infection   . No pertinent past medical history     Patient Active Problem List   Diagnosis Date Noted  . POLYCYSTIC OVARIAN DISEASE 08/19/2009  . OBESITY 08/19/2009    Past Surgical History:  Procedure Laterality Date  . tubes in ears Left 1990    OB History    Gravida Para Term Preterm AB Living   3 3 3     3    SAB TAB Ectopic Multiple Live Births           1       Home Medications    Prior to Admission medications   Medication Sig Start Date End Date Taking? Authorizing Provider  diphenhydramine-acetaminophen (TYLENOL PM) 25-500 MG TABS tablet Take 1 tablet by mouth at bedtime as needed.   Yes [provider]  escitalopram (LEXAPRO) 10 MG tablet Take 1 tablet (10 mg total) daily by mouth. 02/19/17  Yes Jarold Motto, PA  hydrOXYzine (ATARAX/VISTARIL) 25 MG tablet Take 1 tablet (25 mg total) at  bedtime as needed by mouth for anxiety (insomnia). Take one 25 mg tablet 30-60 minutes prior to bedtime for insomnia, anxiety. May increase to two tablets. 02/19/17  Yes Jarold Motto, PA  Magnesium 250 MG TABS Take 1 tablet every other day by mouth.   Yes [provider]  Omega-3 Fatty Acids (OMEGA-3 FISH OIL PO) Take 1 capsule daily by mouth.   Yes [provider]  omeprazole (PRILOSEC) 20 MG capsule Take 1 capsule (20 mg total) by mouth 2 (two) times daily before a meal. 02/02/17  Yes Lemmon, Violet Baldy, PA  ondansetron (ZOFRAN) 4 MG tablet Take 1 tablet (4 mg total) every 8 (eight) hours as needed by mouth for nausea or vomiting. 02/10/17  Yes Armbruster, Willaim Rayas, MD  Phenylephrine-DM-GG-APAP (SUDAFED PE COLD/COUGH PO) Take 2 tablets by mouth daily as needed.   Yes [provider]  POTASSIUM CHLORIDE PO Take 1 tablet every other day by mouth.   Yes [provider]    Family History No family history on file.  Social History Social History   Tobacco Use  . Smoking status: Never Smoker  . Smokeless tobacco: Never Used  Substance Use Topics  . Alcohol use: No  . Drug use: No     Allergies   Patient has no known allergies.   Review of Systems Review of Systems All other systems negative except as documented in  the HPI. All pertinent positives and negatives as reviewed in the HPI.  Physical Exam Updated Vital Signs BP 122/76   Pulse 76   Temp 98.2 F (36.8 C) (Oral)   Resp (!) 0   Ht 5\' 5"  (1.651 m)   Wt 117.9 kg (260 lb)   LMP 02/03/2017 Comment: spotting  SpO2 99%   BMI 43.27 kg/m   Physical Exam  Constitutional: She is oriented to person, place, and time. She appears well-developed and well-nourished. No distress.  HENT:  Head: Normocephalic and atraumatic.  Mouth/Throat: Oropharynx is clear and moist.  Eyes: Pupils are equal, round, and reactive to light.  Neck: Normal range of motion. Neck supple.  Cardiovascular:  Normal rate, regular rhythm and normal heart sounds.  No extrasystoles are present. Exam reveals no gallop and no friction rub.  No murmur heard. Pulmonary/Chest: Effort normal and breath sounds normal. No respiratory distress. She has no wheezes.  Abdominal: Soft. Bowel sounds are normal. She exhibits no shifting dullness, no distension, no pulsatile liver, no fluid wave, no abdominal bruit, no ascites, no pulsatile midline mass and no mass. There is tenderness. There is no rigidity, no guarding and negative Murphy's sign. No hernia.  Musculoskeletal:       Right lower leg: She exhibits no edema.       Left lower leg: She exhibits no edema.  Neurological: She is alert and oriented to person, place, and time. She exhibits normal muscle tone. Coordination normal.  Skin: Skin is warm and dry. Capillary refill takes less than 2 seconds. No rash noted. No erythema.  Psychiatric: She has a normal mood and affect. Her behavior is normal.  Nursing note and vitals reviewed.    ED Treatments / Results  Labs (all labs ordered are listed, but only abnormal results are displayed) Labs Reviewed  BASIC METABOLIC PANEL - Abnormal; Notable for the following components:      Result Value   Glucose, Bld 104 (*)    All other components within normal limits  CBC - Abnormal; Notable for the following components:   RBC 5.22 (*)    Platelets 404 (*)    All other components within normal limits  HEPATIC FUNCTION PANEL  LIPASE, BLOOD  D-DIMER, QUANTITATIVE (NOT AT [facility])  I-STAT TROPONIN, ED  I-STAT TROPONIN, ED    EKG  EKG Interpretation  Date/Time:  Sunday February 21 2017 21:21:49 EST Ventricular Rate:  86 PR Interval:  144 QRS Duration: 88 QT Interval:  354 QTC Calculation: 423 R Axis:   81 Text Interpretation:  Normal sinus rhythm Normal ECG Interpretation limited secondary to artifact Confirmed by Zadie RhineWickline, Donald ([id number]) on 02/22/2017 6:28:19 AM Also confirmed by Zadie RhineWickline, Donald ([id number]),  editor Madalyn RobEverhart, Marilyn [phone])  on 02/22/2017 8:11:32 AM       Radiology Dg Chest 2 View  Result Date: 02/21/2017 CLINICAL DATA:  Bilateral arm and leg tingling onset October 4th. Now burning across chest and back and in both arms. Squeezing chest pressure at times. History of anxiety. EXAM: CHEST  2 VIEW COMPARISON:  Chest x-ray dated 01/12/2017. Chest x-ray dated 07/15/2012. FINDINGS: Heart size and mediastinal contours are stable. Lungs are clear. Lung volumes are normal. No pleural effusion or pneumothorax seen. Osseous structures about the chest are unremarkable. IMPRESSION: No active cardiopulmonary disease. No evidence of pneumonia or pulmonary edema. Electronically Signed   By: Bary RichardStan  Maynard M.D.   On: 02/21/2017 10:55    Procedures Procedures (including critical care time)  Medications Ordered in  ED Medications  sodium chloride 0.9 % bolus 1,000 mL (0 mLs Intravenous Stopped 02/22/17 0930)     Initial Impression / Assessment and Plan / ED Course  I have reviewed the triage vital signs and the nursing notes.  Pertinent labs & imaging results that were available during my care of the patient were reviewed by me and considered in my medical decision making (see chart for details).    The patient will need further outpatient evaluation by cardiology for further workup of her palpitations and abnormal sensation in her chest.  Patient is also being seen by GI discomfort.  Patient does seem very anxious about all of this.  I did prescribe her some anti-anxiolytic type medication for bedtime especially.  Patient is advised to return here as needed patient agrees the plan and all questions were answered   Final Clinical Impressions(s) / ED Diagnoses   Final diagnoses:  None    ED Discharge Orders    None       Charlestine NightLawyer, Dustin Bumbaugh, PA-C 02/22/17 1540    Derwood KaplanNanavati, Ankit, MD 02/24/17 2033

## 2017-02-22 NOTE — ED Notes (Signed)
Labs not drawn previously for unknown reasons.  Phlebotomist who was here is now gone.  Clydie BraunKaren drawing blood now.

## 2017-02-23 ENCOUNTER — Encounter: Payer: Self-pay | Admitting: Physician Assistant

## 2017-02-24 ENCOUNTER — Ambulatory Visit: Payer: Self-pay | Admitting: Cardiovascular Disease

## 2017-03-01 ENCOUNTER — Telehealth: Payer: Self-pay | Admitting: Interventional Cardiology

## 2017-03-01 NOTE — Progress Notes (Signed)
Cardiology Office Note    Date:  03/02/2017   ID:  Crystal Salinas, DOB Jul 31, 1980, MRN [id number]  PCP:  Jarold MottoWorley, Samantha, PA  Cardiologist: Lesleigh NoeHenry W Philena Obey III, MD   Chief Complaint  Patient presents with  . Palpitations    History of Present Illness:  Crystal Salinas is a 36 y.o. female evaluated at request of Jarold MottoSamantha Worley PA, for evaluation of burning sensation in the left arm and palpitations.  The complaints occur primarily to sleep.  Purchasing irregular heartbeat and becomes somewhat frightened.  He is a Engineer, civil (consulting)nurse at [employer]..  She is also having difficulty with awakening feeling that her heart is racing or pounding she has been told by her family that she snores but has never received information concerning gasping for air or apnea.  She works a full schedule.  She has 5 children 3 of biologic and 2 are a foster children who belonged to her father-in-law.  She does not sleep well.  She does not smoke, drink, supplement/occasion she has had multiple emergency room visits over the past 3 weeks with various complaints including chest discomfort, left arm paresthesias, generalized abdominal and atypical chest pain.  Frequently when she awakens from sleep with palpitations she feels short of breath.  At work she is able to go at a strong steady pace without tiring or developing discomfort in the chest, dyspnea, or arm numbness.  Past Medical History:  Diagnosis Date  . Anxiety   . Breast mass    left breast mass  . HSV (herpes simplex virus) anogenital infection   . No pertinent past medical history     Past Surgical History:  Procedure Laterality Date  . tubes in ears Left 1990    Current Medications: Outpatient Medications Prior to Visit  Medication Sig Dispense Refill  . escitalopram (LEXAPRO) 10 MG tablet Take 1 tablet (10 mg total) daily by mouth. 30 tablet 1  . esomeprazole (NEXIUM) 20 MG capsule Take 20 mg by mouth every morning.    Marland Kitchen. LORazepam (ATIVAN)  0.5 MG tablet Take 1 tablet (0.5 mg total) at bedtime by mouth. 15 tablet 0  . Magnesium 250 MG TABS Take 1 tablet every other day by mouth.    . Omega-3 Fatty Acids (OMEGA-3 FISH OIL PO) Take 1 capsule daily by mouth.    Marland Kitchen. omeprazole (PRILOSEC) 20 MG capsule Take 20 mg by mouth at bedtime.    . ondansetron (ZOFRAN) 4 MG tablet Take 1 tablet (4 mg total) every 8 (eight) hours as needed by mouth for nausea or vomiting. 20 tablet 0  . POTASSIUM CHLORIDE PO Take 1 tablet every other day by mouth.    . diphenhydramine-acetaminophen (TYLENOL PM) 25-500 MG TABS tablet Take 1 tablet by mouth at bedtime as needed.    . hydrOXYzine (ATARAX/VISTARIL) 25 MG tablet Take 1 tablet (25 mg total) at bedtime as needed by mouth for anxiety (insomnia). Take one 25 mg tablet 30-60 minutes prior to bedtime for insomnia, anxiety. May increase to two tablets. (Patient not taking: Reported on 03/02/2017) 60 tablet 0  . omeprazole (PRILOSEC) 20 MG capsule Take 1 capsule (20 mg total) by mouth 2 (two) times daily before a meal. (Patient not taking: Reported on 03/02/2017) 60 capsule 3  . Phenylephrine-DM-GG-APAP (SUDAFED PE COLD/COUGH PO) Take 2 tablets by mouth daily as needed.     Facility-Administered Medications Prior to Visit  Medication Dose Route Frequency Provider Last Rate Last Dose  . 0.9 %  sodium chloride infusion  500 mL Intravenous Continuous Armbruster, Willaim Rayas, MD         Allergies:   Patient has no known allergies.   Social History   Socioeconomic History  . Marital status: Single    Spouse name: None  . Number of children: None  . Years of education: None  . Highest education level: None  Social Needs  . Financial resource strain: None  . Food insecurity - worry: None  . Food insecurity - inability: None  . Transportation needs - medical: None  . Transportation needs - non-medical: None  Occupational History  . None  Tobacco Use  . Smoking status: Never Smoker  . Smokeless tobacco: Never  Used  Substance and Sexual Activity  . Alcohol use: No  . Drug use: No  . Sexual activity: Yes    Birth control/protection: IUD    Comment: Mirena  Other Topics Concern  . 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   ROS:   Please see the history of present illness.    Abdominal pain, chills, anxiety, chills, change in appetite, snoring. All other systems reviewed and are negative.   PHYSICAL EXAM:   VS:  BP 124/80   Pulse 94   Ht 5\' 5"  (1.651 m)   Wt 257 lb 1.9 oz (116.6 kg)   LMP 02/03/2017 Comment: spotting  BMI 42.79 kg/m     GEN: Pleasant morbidly obese African-American female HEENT: normal  Neck: no JVD, carotid bruits, or masses Cardiac: RRR; no murmurs, rubs, or gallops,no edema  Respiratory:  clear to auscultation bilaterally, normal work of breathing GI: soft, nontender, nondistended, + BS MS: no deformity or atrophy  Skin: warm and dry, no rash Neuro:  Alert and Oriented x 3, Strength and sensation are intact Psych: euthymic mood, full affect  Wt Readings from Last 3 Encounters:  03/02/17 257 lb 1.9 oz (116.6 kg)  02/21/17 260 lb (117.9 kg)  02/19/17 261 lb (118.4 kg)      Studies/Labs Reviewed:   EKG:  EKG sinus rhythm 86 bpm without significant abnormality.  No change when compared to prior tracings.  Recent Labs: 02/12/2017: TSH 1.12 02/22/2017: ALT 19; BUN 9; Creatinine, Ser 0.83; Hemoglobin 13.6; Platelets 404; Potassium 3.7; Sodium 136   Lipid Panel No results found for: CHOL, TRIG, HDL, CHOLHDL, VLDL, LDLCALC, LDLDIRECT  Additional studies/ records that were reviewed today include:  Recent  hospital visits for various complaints has resulted in multiple troponin determinations which have all been negative.    ASSESSMENT:    1. Snoring   2. Chest tightness   3. Palpitations   4. SOB (shortness of breath)      PLAN:  In order of problems listed above:  1. With sleep apnea needs to be excluded given the history of frequent awakening from sleep and observation that she snores.  Could be the source of the palpitations as well. 2. The chest discomfort is fleeting and very atypical.  I do not believe an ischemic evaluation is necessary at this time. 3.  48-hour Holter monitor to identify the presence or absence of any significant arrhythmia.  This timeframe should be adequate given nightly occurrence according to the patient. 4. An echocardiogram will be done to evaluate for evidence of right heart dysfunction in the setting of obesity and snoring.  Holter monitor 48 hours, 2D Doppler echocardiogram to assess for evidence of pulmonary hypertension, and sleep study to rule out obstructive sleep apnea.  Significant time was spent with the patient.  No significant clinical abnormalities are noted on exam.  I do believe that she is under significant stress related to the needs of her large family and the requirement that she work.  She is also on a night schedule and has to try to sleep during the daytime.  Symptoms became more noticeable during this timeframe.  If the above workup is negative, perhaps the answer would be related to situational stress/anxiety.  Medication Adjustments/Labs and Tests Ordered: Current medicines are reviewed at length with the patient today.  Concerns regarding medicines are outlined above.  Medication changes, Labs and Tests ordered today are listed in the Patient Instructions below. Patient Instructions  Medication Instructions:  Your physician recommends that you continue on your current medications as directed. Please refer to the Current Medication  list given to you today.  Labwork: None  Testing/Procedures: Your physician has recommended that you wear a 48 hour holter monitor. Holter monitors are medical devices that record the heart's electrical activity. Doctors most often use these monitors to diagnose arrhythmias. Arrhythmias are problems with the speed or rhythm of the heartbeat. The monitor is a small, portable device. You can wear one while you do your normal daily activities. This is usually used to diagnose what is causing palpitations/syncope (passing out).  Your physician has requested that you have an echocardiogram. Echocardiography is a painless test that uses sound waves to create images of your heart. It provides your doctor with information about the size and shape of your heart and how well your heart's chambers and valves are working. This procedure takes approximately one hour. There are no restrictions for this procedure.  Your physician has recommended that you have a sleep study. This test records several body functions during sleep, including: brain activity, eye movement, oxygen and carbon dioxide blood levels, heart rate and rhythm, breathing rate and rhythm, the flow of air through your mouth and nose, snoring, body muscle movements, and chest and belly movement.    Follow-Up: Your physician recommends that you schedule a follow-up appointment as needed with Dr. Katrinka BlazingSmith.    Any Other Special Instructions Will Be Listed Below (If Applicable).     If you need a refill on your cardiac medications before your next appointment, please call your pharmacy.      Signed, Lesleigh NoeHenry W Sujey Gundry III, MD  03/02/2017 12:52 PM    [facility] [address], [place], [place]  [address] Phone: [phone]; Fax: [phone]

## 2017-03-01 NOTE — Telephone Encounter (Signed)
Close encounter 

## 2017-03-02 ENCOUNTER — Telehealth: Payer: Self-pay | Admitting: *Deleted

## 2017-03-02 ENCOUNTER — Ambulatory Visit (INDEPENDENT_AMBULATORY_CARE_PROVIDER_SITE_OTHER): Payer: Self-pay | Admitting: Interventional Cardiology

## 2017-03-02 ENCOUNTER — Encounter (HOSPITAL_COMMUNITY): Payer: Self-pay

## 2017-03-02 ENCOUNTER — Encounter: Payer: Self-pay | Admitting: Interventional Cardiology

## 2017-03-02 VITALS — BP 124/80 | HR 94 | Ht 65.0 in | Wt 257.1 lb

## 2017-03-02 DIAGNOSIS — R0602 Shortness of breath: Secondary | ICD-10-CM

## 2017-03-02 DIAGNOSIS — R0789 Other chest pain: Secondary | ICD-10-CM

## 2017-03-02 DIAGNOSIS — R0683 Snoring: Secondary | ICD-10-CM

## 2017-03-02 DIAGNOSIS — R002 Palpitations: Secondary | ICD-10-CM

## 2017-03-02 NOTE — Telephone Encounter (Signed)
-----   Message from Silas SacramentoDonna M Price sent at 03/02/2017 10:19 AM EST ----- Regarding: sleep study You may already know this, but Dr Katrinka BlazingSmith want this pt to have a sleep study.  Thank you

## 2017-03-02 NOTE — Telephone Encounter (Signed)
Sleep study referral sent to pool

## 2017-03-02 NOTE — Patient Instructions (Signed)
Medication Instructions:  Your physician recommends that you continue on your current medications as directed. Please refer to the Current Medication list given to you today.  Labwork: None  Testing/Procedures: Your physician has recommended that you wear a 48 hour holter monitor. Holter monitors are medical devices that record the heart's electrical activity. Doctors most often use these monitors to diagnose arrhythmias. Arrhythmias are problems with the speed or rhythm of the heartbeat. The monitor is a small, portable device. You can wear one while you do your normal daily activities. This is usually used to diagnose what is causing palpitations/syncope (passing out).  Your physician has requested that you have an echocardiogram. Echocardiography is a painless test that uses sound waves to create images of your heart. It provides your doctor with information about the size and shape of your heart and how well your heart's chambers and valves are working. This procedure takes approximately one hour. There are no restrictions for this procedure.  Your physician has recommended that you have a sleep study. This test records several body functions during sleep, including: brain activity, eye movement, oxygen and carbon dioxide blood levels, heart rate and rhythm, breathing rate and rhythm, the flow of air through your mouth and nose, snoring, body muscle movements, and chest and belly movement.    Follow-Up: Your physician recommends that you schedule a follow-up appointment as needed with Dr. Katrinka BlazingSmith.    Any Other Special Instructions Will Be Listed Below (If Applicable).     If you need a refill on your cardiac medications before your next appointment, please call your pharmacy.

## 2017-03-04 ENCOUNTER — Ambulatory Visit (HOSPITAL_COMMUNITY): Payer: Self-pay | Attending: Internal Medicine

## 2017-03-04 ENCOUNTER — Other Ambulatory Visit: Payer: Self-pay

## 2017-03-04 DIAGNOSIS — I517 Cardiomegaly: Secondary | ICD-10-CM | POA: Insufficient documentation

## 2017-03-04 DIAGNOSIS — R002 Palpitations: Secondary | ICD-10-CM

## 2017-03-04 DIAGNOSIS — R0602 Shortness of breath: Secondary | ICD-10-CM

## 2017-03-04 DIAGNOSIS — F419 Anxiety disorder, unspecified: Secondary | ICD-10-CM | POA: Insufficient documentation

## 2017-03-09 ENCOUNTER — Encounter (HOSPITAL_COMMUNITY): Payer: Self-pay

## 2017-03-09 ENCOUNTER — Ambulatory Visit (HOSPITAL_COMMUNITY)
Admission: RE | Admit: 2017-03-09 | Discharge: 2017-03-09 | Disposition: A | Discharge status: Home / Self Care | Payer: PRIVATE HEALTH INSURANCE | Source: Ambulatory Visit | Attending: *Deleted | Admitting: *Deleted

## 2017-03-09 ENCOUNTER — Telehealth: Payer: Self-pay | Admitting: Cardiology

## 2017-03-09 DIAGNOSIS — IMO0002 Reserved for concepts with insufficient information to code with codable children: Secondary | ICD-10-CM

## 2017-03-09 DIAGNOSIS — N6320 Unspecified lump in the left breast, unspecified quadrant: Secondary | ICD-10-CM

## 2017-03-09 DIAGNOSIS — N6321 Unspecified lump in the left breast, upper outer quadrant: Secondary | ICD-10-CM | POA: Insufficient documentation

## 2017-03-09 DIAGNOSIS — R229 Localized swelling, mass and lump, unspecified: Secondary | ICD-10-CM

## 2017-03-09 NOTE — Telephone Encounter (Signed)
Pt currently self pay.  She is applying for Medicaid today.

## 2017-03-10 ENCOUNTER — Telehealth: Payer: Self-pay | Admitting: Gastroenterology

## 2017-03-10 NOTE — Telephone Encounter (Signed)
Left message for patient to call back, need to know if medication is helping her or not.

## 2017-03-10 NOTE — Telephone Encounter (Signed)
Pt said she is returning your call °

## 2017-03-10 NOTE — Telephone Encounter (Signed)
Glad to hear it has helped. Why doesn't she try to wean off, take it every other day for 2 weeks and then stop. If symptoms recur she can use it as needed or daily if needed. Thanks

## 2017-03-10 NOTE — Telephone Encounter (Signed)
Patient states that the OTC omeprazole seems to be helping, no further symptoms of burning sensation in chest. Please advise.

## 2017-03-11 NOTE — Telephone Encounter (Signed)
Patient advised of plan, will start weaning off. She understands to restart prn or daily if needed.

## 2017-03-24 ENCOUNTER — Ambulatory Visit (INDEPENDENT_AMBULATORY_CARE_PROVIDER_SITE_OTHER): Payer: Self-pay | Admitting: Family Medicine

## 2017-03-24 ENCOUNTER — Encounter: Payer: Self-pay | Admitting: Family Medicine

## 2017-03-24 VITALS — BP 110/78 | HR 101 | Temp 99.2°F | Ht 65.0 in | Wt 270.6 lb

## 2017-03-24 DIAGNOSIS — H6691 Otitis media, unspecified, right ear: Secondary | ICD-10-CM

## 2017-03-24 MED ORDER — AMOXICILLIN-POT CLAVULANATE 875-125 MG PO TABS: 1.0000 | ORAL_TABLET | Freq: Two times a day (BID) | ORAL | 0 refills | Status: AC

## 2017-03-24 NOTE — Patient Instructions (Signed)
Right ear infection - seems to be irritating inner ear as well  Treat ear infection with augmentin for 7 days  If the dizziness with head movement becomes more of an issue can try meclizine up to 25mg  over the counter  If symptoms are worsening or not improving over next week return to see us

## 2017-03-24 NOTE — Progress Notes (Signed)
Subjective:  Crystal Salinas is a 36 y.o. year old very pleasant female patient who presents for/with See problem oriented charting ROS- no fever- though does have some elevated temperatures. No chills. No nausea or vomiting. Has some vertigo. No headache or blurry vision.    Past Medical History-  Patient Active Problem List   Diagnosis Date Noted  . Chest tightness 03/01/2017  . Palpitations 03/01/2017  . POLYCYSTIC OVARIAN DISEASE 08/19/2009  . OBESITY 08/19/2009    Medications- reviewed and updated Current Outpatient Medications  Medication Sig Dispense Refill  . escitalopram (LEXAPRO) 10 MG tablet Take 1 tablet (10 mg total) daily by mouth. 30 tablet 1  . esomeprazole (NEXIUM) 20 MG capsule Take 20 mg by mouth every morning.    Marland Kitchen. LORazepam (ATIVAN) 0.5 MG tablet Take 1 tablet (0.5 mg total) at bedtime by mouth. 15 tablet 0  . Magnesium 250 MG TABS Take 1 tablet every other day by mouth.    . Omega-3 Fatty Acids (OMEGA-3 FISH OIL PO) Take 1 capsule daily by mouth.    Marland Kitchen. omeprazole (PRILOSEC) 20 MG capsule Take 20 mg by mouth at bedtime.    . ondansetron (ZOFRAN) 4 MG tablet Take 1 tablet (4 mg total) every 8 (eight) hours as needed by mouth for nausea or vomiting. 20 tablet 0  . POTASSIUM CHLORIDE PO Take 1 tablet every other day by mouth.    Marland Kitchen. amoxicillin-clavulanate (AUGMENTIN) 875-125 MG tablet Take 1 tablet by mouth 2 (two) times daily for 7 days. 14 tablet 0   No current facility-administered medications for this visit.     Objective: BP 110/78 (BP Location: Left Arm, Patient Position: Sitting, Cuff Size: Large)   Pulse (!) 101   Temp 99.2 F (37.3 C) (Oral)   Ht 5\' 5"  (1.651 m)   Wt 270 lb 9.6 oz (122.7 kg)   SpO2 97%   BMI 45.03 kg/m  Gen: NAD, resting comfortably Scarring on both TMs. Left TM normal other than scarring. Right TM with mild erythema upper third but also opaque on non scarred portion compared to clear on the left.  CV: RRR  Lungs:  nonlabored Abdomen:obese Ext: no edema Skin: warm, dry, no rash over ears  Assessment/Plan:  Right otitis media S: Patient states 2-3 days went to stand up from being bent over and noted some vertigo. Then she started with right ear pain shortly thereafter- this has intensified and is a moderate aching sensation. Does admit to some sneezing, cough, congestion though mild over last week. Temperature into 99s with this. Symptoms seem to be getting worse. Multiple ear infections as a child A/P: adult otitis media- will use augmentin instead of amoxicillin. Has some inner ear affects to this- can use meclizine prn though so short (few seconds) and only with turning head fast so likely will not need- could be some mild BPPV but suspect related to the ear infection. 7 days of treatment. Return precautions given.   Meds ordered this encounter  Medications  . amoxicillin-clavulanate (AUGMENTIN) 875-125 MG tablet    Sig: Take 1 tablet by mouth 2 (two) times daily for 7 days.    Dispense:  14 tablet    Refill:  0   Return precautions advised.  Tana ConchStephen Yosef Krogh, MD

## 2017-04-19 ENCOUNTER — Telehealth: Payer: Self-pay | Admitting: Interventional Cardiology

## 2017-04-19 NOTE — Telephone Encounter (Signed)
New Message  Patient c/o Palpitations:  High priority if patient c/o lightheadedness, shortness of breath, or chest pain  1) How long have you had palpitations/irregular HR/ Afib? Are you having the symptoms now? On and off since October   2) Are you currently experiencing lightheadedness, SOB or CP? no  3) Do you have a history of afib (atrial fibrillation) or irregular heart rhythm? no 4) Have you checked your BP or HR? (document readings if available): no  5) Are you experiencing any other symptoms? No  Patient states the flutters are more frequent and would just like to speak with a nurse.

## 2017-04-19 NOTE — Telephone Encounter (Signed)
Tried to call patient back. No answer and no voicemail to leave message. Will try again later. Patient is scheduled for holter monitor this week, which has been ordered since November. Patient had an echo, which results showed, structurally normal with normal function.

## 2017-04-20 NOTE — Telephone Encounter (Signed)
Attempted to call pt back.  No VM set up.  Will try again later.

## 2017-04-20 NOTE — Telephone Encounter (Signed)
New message ° °Pt verbalized that she is returning call for RN °

## 2017-04-20 NOTE — Telephone Encounter (Signed)
Pt states that she has been having palpitations more frequently since her last visit.  Pt admits to drinking more tea and coffee recently.  Palps tend to occur more when laying down. Occasionally wakes her up at night.  Advised pt to cut back on caffeine intake and continue with plan to have monitor placed on 1/17 so we can see what kind of rhythm she is in.  Pt verbalized understanding and was in agreement with this plan.

## 2017-04-22 ENCOUNTER — Ambulatory Visit (INDEPENDENT_AMBULATORY_CARE_PROVIDER_SITE_OTHER): Payer: Self-pay

## 2017-04-22 DIAGNOSIS — R002 Palpitations: Secondary | ICD-10-CM

## 2017-07-26 ENCOUNTER — Ambulatory Visit: Payer: 59 | Admitting: Physician Assistant

## 2017-07-26 ENCOUNTER — Ambulatory Visit (INDEPENDENT_AMBULATORY_CARE_PROVIDER_SITE_OTHER): Payer: 59

## 2017-07-26 ENCOUNTER — Encounter: Payer: Self-pay | Admitting: Physician Assistant

## 2017-07-26 ENCOUNTER — Other Ambulatory Visit (HOSPITAL_COMMUNITY)
Admission: RE | Admit: 2017-07-26 | Discharge: 2017-07-26 | Disposition: A | Discharge status: Home / Self Care | Payer: 59 | Source: Ambulatory Visit | Attending: Family Medicine | Admitting: Family Medicine

## 2017-07-26 VITALS — BP 110/74 | HR 75 | Temp 98.5°F | Ht 65.0 in | Wt 277.0 lb

## 2017-07-26 DIAGNOSIS — R1030 Lower abdominal pain, unspecified: Secondary | ICD-10-CM

## 2017-07-26 DIAGNOSIS — N898 Other specified noninflammatory disorders of vagina: Secondary | ICD-10-CM | POA: Insufficient documentation

## 2017-07-26 LAB — CBC WITH DIFFERENTIAL/PLATELET
BASOS ABS: 0 10*3/uL (ref 0.0–0.1)
Basophils Relative: 0.6 % (ref 0.0–3.0)
Eosinophils Absolute: 0 10*3/uL (ref 0.0–0.7)
Eosinophils Relative: 0.8 % (ref 0.0–5.0)
HCT: 38.8 % (ref 36.0–46.0)
Hemoglobin: 12.7 g/dL (ref 12.0–15.0)
LYMPHS ABS: 2 10*3/uL (ref 0.7–4.0)
Lymphocytes Relative: 35.4 % (ref 12.0–46.0)
MCHC: 32.8 g/dL (ref 30.0–36.0)
MCV: 78.2 fl (ref 78.0–100.0)
MONO ABS: 0.4 10*3/uL (ref 0.1–1.0)
Monocytes Relative: 7.5 % (ref 3.0–12.0)
Neutro Abs: 3.2 10*3/uL (ref 1.4–7.7)
Neutrophils Relative %: 55.7 % (ref 43.0–77.0)
PLATELETS: 377 10*3/uL (ref 150.0–400.0)
RBC: 4.96 Mil/uL (ref 3.87–5.11)
RDW: 15.3 % (ref 11.5–15.5)
WBC: 5.8 10*3/uL (ref 4.0–10.5)

## 2017-07-26 LAB — POCT URINALYSIS DIPSTICK
Bilirubin, UA: NEGATIVE
Glucose, UA: NEGATIVE
Ketones, UA: NEGATIVE
LEUKOCYTES UA: NEGATIVE
NITRITE UA: NEGATIVE
PROTEIN UA: NEGATIVE
RBC UA: NEGATIVE
SPEC GRAV UA: 1.015 (ref 1.010–1.025)
UROBILINOGEN UA: 0.2 U/dL
pH, UA: 8 (ref 5.0–8.0)

## 2017-07-26 LAB — COMPREHENSIVE METABOLIC PANEL
ALT: 18 U/L (ref 0–35)
AST: 16 U/L (ref 0–37)
Albumin: 4.1 g/dL (ref 3.5–5.2)
Alkaline Phosphatase: 69 U/L (ref 39–117)
BUN: 14 mg/dL (ref 6–23)
CO2: 27 meq/L (ref 19–32)
Calcium: 9.6 mg/dL (ref 8.4–10.5)
Chloride: 101 mEq/L (ref 96–112)
Creatinine, Ser: 0.74 mg/dL (ref 0.40–1.20)
GFR: 112.51 mL/min (ref >60.00)
GLUCOSE: 91 mg/dL (ref 70–99)
POTASSIUM: 4 meq/L (ref 3.5–5.1)
SODIUM: 136 meq/L (ref 135–145)
Total Bilirubin: 0.4 mg/dL (ref 0.2–1.2)
Total Protein: 7.1 g/dL (ref 6.0–8.3)

## 2017-07-26 LAB — POCT URINE PREGNANCY: PREG TEST UR: NEGATIVE

## 2017-07-26 LAB — LIPASE: Lipase: 12 U/L (ref 11.0–59.0)

## 2017-07-26 NOTE — Patient Instructions (Addendum)
It was great to see you!  We will call you with your lab and xray results.  If your pain returns or if you having any additional symptoms, please let us know. We may need to do additional testing, such as an abdominal/pelvic ultrasound and/or CT scan if symptoms worsen.  Please return for a physical!!!

## 2017-07-26 NOTE — Progress Notes (Signed)
Crystal Salinas is a 37 y.o. female here for a new problem.  I acted as a Neurosurgeon for [employer], PA-C [name], LPN  History of Present Illness:   Chief Complaint  Patient presents with  . Abdominal Pain    LUQ    Abdominal Pain  This is a new problem. Episode onset: Started on Saturday. The onset quality is sudden. The problem occurs constantly (Today the pain is gone). The most recent episode lasted 2 days. The problem has been resolved. The pain is located in the LUQ (radiated to mid back and also bilateral lower abdomen). The pain is at a severity of 6/10. The pain is moderate. The quality of the pain is cramping, aching and burning. The abdominal pain radiates to the back. Associated symptoms include belching, constipation, a fever (yesterday 100.2), flatus, headaches and nausea. Pertinent negatives include no diarrhea, dysuria, frequency, hematuria or vomiting. Nothing aggravates the pain. The pain is relieved by nothing. She has tried antacids and acetaminophen (Alka-seltzer, ginger ale) for the symptoms. The treatment provided mild relief. Her past medical history is significant for GERD. There is no history of gallstones, irritable bowel syndrome, pancreatitis or ulcerative colitis.   The pain woke her up out of her sleep. She doesn't have worsening pain with eating or movement. She has been working out more recently, thought maybe she had pulled a muscle. Used a heating pad -- provided mild relief. Also had some intermittent nausea. Has been taking her GERD medication, drinking Gingerale, taking Alka Seltzer. Did have some mild relief with this as well.  No unintentional weight loss.  Has recently changed her eating habits within the past month. Has small BM's. Does have to strain often, some are pellet-like. Has had issues with constipation recently. Is having vaginal discharge - had some thick white discharge this morning. Denies prior ovarian cyst rupture.  Denies recent  alcohol intake. Denies excessive NSAID or aspirin use. No blood from vagina or rectum.  Wt Readings from Last 10 Encounters:  07/26/17 277 lb (125.6 kg)  03/24/17 270 lb 9.6 oz (122.7 kg)  03/02/17 257 lb 1.9 oz (116.6 kg)  02/21/17 260 lb (117.9 kg)  02/19/17 261 lb (118.4 kg)  02/12/17 262 lb 4 oz (119 kg)  02/08/17 265 lb (120.2 kg)  02/02/17 265 lb 6.4 oz (120.4 kg)  01/12/17 274 lb (124.3 kg)  09/27/15 274 lb (124.3 kg)      Past Medical History:  Diagnosis Date  . Anxiety   . Breast mass    left breast mass  . HSV (herpes simplex virus) anogenital infection   . No pertinent past medical history      Social History   Socioeconomic History  . Marital status: Single    Spouse name: Not on file  . Number of children: Not on file  . Years of education: Not on file  . Highest education level: Not on file  Occupational History  . Not on file  Social Needs  . Financial resource strain: Not on file  . Food insecurity:    Worry: Not on file    Inability: Not on file  . Transportation needs:    Medical: Not on file    Non-medical: Not on file  Tobacco Use  . Smoking status: Never Smoker  . Smokeless tobacco: Never Used  Substance and Sexual Activity  . Alcohol use: No  . Drug use: No  . Sexual activity: Yes    Birth control/protection: IUD  Comment: Mirena  Lifestyle  . Physical activity:    Days per week: Not on file    Minutes per session: Not on file  . Stress: Not on file  Relationships  . Social connections:    Talks on phone: Not on file    Gets together: Not on file    Attends religious service: Not on file    Active member of club or organization: Not on file    Attends meetings of clubs or organizations: Not on file    Relationship status: Not on file  . Intimate partner violence:    Fear of current or ex partner: Not on file    Emotionally abused: Not on file    Physically abused: Not on file    Forced sexual activity: Not on file  Other  Topics Concern  . 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 tubes in ears Left 1990    Family History  Problem Relation Age of Onset  . Multiple sclerosis Father   . Diabetes Maternal Grandmother   . Emphysema Maternal Grandmother   . Stroke Paternal Grandmother   . Hypertension Paternal Grandmother     No Known Allergies  Current Medications:   Current Outpatient Medications:  .  LORazepam (ATIVAN) 0.5 MG tablet, Take 1 tablet (0.5 mg total) at bedtime by mouth., Disp: 15 tablet, Rfl: 0 .  Magnesium 250 MG TABS, Take 1 tablet every other day by mouth., Disp: , Rfl:  .  Omega-3 Fatty Acids (OMEGA-3 FISH OIL PO), Take 1 capsule daily by mouth., Disp: , Rfl:  .  omeprazole (PRILOSEC) 20 MG capsule, Take 20 mg by mouth at bedtime., Disp: , Rfl:  .  ondansetron (ZOFRAN) 4 MG tablet, Take 1 tablet (4 mg total) every 8 (eight) hours as needed by mouth for nausea or vomiting., Disp: 20 tablet, Rfl: 0 .  POTASSIUM CHLORIDE PO, Take 1 tablet every other day by mouth., Disp: , Rfl:  .  escitalopram (LEXAPRO) 10 MG tablet, Take 1 tablet (10 mg total) daily by mouth. (Patient not taking: Reported on 07/26/2017), Disp: 30 tablet, Rfl: 1   Review of Systems:   Review of Systems  Constitutional: Positive for fever (yesterday 100.2).  Gastrointestinal: Positive for abdominal pain, constipation, flatus and nausea. Negative for diarrhea and vomiting.  Genitourinary: Negative for dysuria, frequency and hematuria.  Neurological: Positive for headaches.    Vitals:   Vitals:   07/26/17 0940  BP: 110/74  Pulse: 75  Temp: 98.5 F (36.9 C)   TempSrc: Oral  SpO2: 96%  Weight: 277 lb (125.6 kg)  Height: 5\' 5"  (1.651 m)     Body mass index is 46.1 kg/m.  Physical Exam:   Physical Exam  Constitutional: She appears well-developed. She is cooperative.  Non-toxic appearance. She does not have a sickly appearance. She does not appear ill. No distress.  Cardiovascular: Normal rate, regular rhythm, S1 normal, S2 normal, normal heart sounds and normal pulses.  No LE edema  Pulmonary/Chest: Effort normal and breath sounds normal.  Abdominal: Normal appearance  and bowel sounds are normal. There is no tenderness. There is no rigidity, no rebound, no guarding, no CVA tenderness, no tenderness at McBurney's point and negative Murphy's sign.  Genitourinary: Vagina normal. There is no rash, tenderness or lesion on the right labia. There is no tenderness or lesion on the left labia.  Genitourinary Comments: Thick whitish-yellow vaginal discharge  Neurological: She is alert. GCS eye subscore is 4. GCS verbal subscore is 5. GCS motor subscore is 6.  Skin: Skin is warm, dry and intact.  Psychiatric: She has a normal mood and affect. Her speech is normal and behavior is normal.  Nursing note and vitals reviewed.  Results for orders placed or performed in visit on 07/26/17  POCT urinalysis dipstick  Result Value Ref Range   Color, UA Yellow    Clarity, UA Clear    Glucose, UA Negative    Bilirubin, UA Negative    Ketones, UA Negative    Spec Grav, UA 1.015 1.010 - 1.025   Blood, UA Negative    pH, UA 8.0 5.0 - 8.0   Protein, UA Negative    Urobilinogen, UA 0.2 0.2 or 1.0 E.U./dL   Nitrite, UA Negative    Leukocytes, UA Negative Negative   Appearance     Odor    POCT urine pregnancy  Result Value Ref Range   Preg Test, Ur Negative Negative   Abd xray with moderate stool burden -- official read pending  Assessment and Plan:    [name] was seen today for abdominal pain.  Diagnoses and all orders for this visit:  Lower  abdominal pain Urinalysis negative. Urine pregnancy negative. No red flags on exam and patient is asymptomatic at this time. Suspect possible constipation/gas. We did obtain labs for further evaluation and to r/o other etiology. Abdominal xray pending. Consider abd and/or pelvic u/s if symptoms worsen or persist, or CT scan if warranted. Red flags reviewed. -     POCT urinalysis dipstick -     POCT urine pregnancy -     DG Abd 2 Views; Future -     CBC with Differential/Platelet -     Comprehensive metabolic panel -     Lipase  Vaginal discharge Wet prep obtained. Treatment based on results. She did consent to STI testing today. -     Cervicovaginal ancillary only  . Reviewed expectations re: course of current medical issues. . Discussed self-management of symptoms. . Outlined signs and symptoms indicating need for more acute intervention. . Patient verbalized understanding and all questions were answered. . See orders for this visit as documented in the electronic medical record. . Patient received an After-Visit Summary.  CMA or LPN served as scribe during this visit. History, Physical, and Plan performed by medical provider. Documentation and orders reviewed and attested to.  Jarold MottoSamantha Uniqua Kihn, PA-C

## 2017-07-27 ENCOUNTER — Telehealth: Payer: Self-pay | Admitting: Obstetrics & Gynecology

## 2017-07-27 ENCOUNTER — Encounter: Payer: Self-pay | Admitting: Physician Assistant

## 2017-07-27 ENCOUNTER — Encounter (HOSPITAL_COMMUNITY): Payer: Self-pay

## 2017-07-27 ENCOUNTER — Ambulatory Visit (HOSPITAL_COMMUNITY)
Admission: RE | Admit: 2017-07-27 | Discharge: 2017-07-27 | Disposition: A | Discharge status: Home / Self Care | Payer: 59 | Source: Ambulatory Visit | Attending: Physician Assistant | Admitting: Physician Assistant

## 2017-07-27 ENCOUNTER — Ambulatory Visit: Payer: 59 | Admitting: Physician Assistant

## 2017-07-27 VITALS — BP 138/72 | HR 88 | Temp 98.8°F | Wt 275.8 lb

## 2017-07-27 DIAGNOSIS — R1030 Lower abdominal pain, unspecified: Secondary | ICD-10-CM

## 2017-07-27 DIAGNOSIS — R109 Unspecified abdominal pain: Secondary | ICD-10-CM | POA: Diagnosis not present

## 2017-07-27 MED ORDER — IOPAMIDOL (ISOVUE-300) INJECTION 61%
30.0000 mL | Freq: Once | INTRAVENOUS | Status: AC | PRN
  Administered 2017-07-27: 30 mL via ORAL

## 2017-07-27 MED ORDER — IOHEXOL 300 MG/ML  SOLN
100.0000 mL | Freq: Once | INTRAMUSCULAR | Status: AC | PRN
  Administered 2017-07-27: 100 mL via INTRAVENOUS

## 2017-07-27 MED ORDER — IOPAMIDOL (ISOVUE-300) INJECTION 61%
INTRAVENOUS | Status: AC
  Filled 2017-07-27: qty 30

## 2017-07-27 NOTE — Patient Instructions (Addendum)
Contact a health care provider if:  Your abdominal pain changes or gets worse.  You are not hungry or you lose weight without trying.  You are constipated or have diarrhea for more than 2-3 days.  You have pain when you urinate or have a bowel movement.  Your abdominal pain wakes you up at night.  Your pain gets worse with meals, after eating, or with certain foods.  You are throwing up and cannot keep anything down.  You have a fever. Get help right away if:  Your pain does not go away as soon as your health care provider told you to expect.  You cannot stop throwing up.  Your pain is only in areas of the abdomen, such as the right side or the left lower portion of the abdomen.  You have bloody or black stools, or stools that look like tar.  You have severe pain, cramping, or bloating in your abdomen.  You have signs of dehydration, such as: ? Dark urine, very little urine, or no urine. ? Cracked lips. ? Dry mouth. ? Sunken eyes. ? Sleepiness. ? Weakness. This information is not intended to replace advice given to you by your health care provider. Make sure you discuss any questions you have with your health care provider.

## 2017-07-27 NOTE — Progress Notes (Signed)
Crystal Salinas is a 37 y.o. female is here to discuss:   History of Present Illness:   Chief Complaint  Patient presents with  . Abdominal Pain    HPI   Patient returns today to discuss abdominal pain -- was last seen by me yesterday 07/26/17.  Since she saw me yesterday she reports that her pain has now radiated to her back. Reports that her pain has worsened. She did eat hamburger and hot dogs at 4pm yesterday. Took 1 dose of Miralax and had a few small hard balls of stool afterwards. She is passing gas. She had water at 9pm yesterday and no fluids since. She is also having heartburn. Denies CP, SOB, n/v. She is wondering if her IUD is causing her symptoms because she knows it needs to come out.  Labs yesterday -- lipase, CMP and CBC all WNL. UA negative and urine pregnancy negative.  Abd xray --  IMPRESSION: Intrauterine device is noted. No evidence of bowel obstruction or ileus.   Health Maintenance Due  Topic Date Due  . TETANUS/TDAP  10/23/1997  . PAP SMEAR  07/05/2016    Past Medical History:  Diagnosis Date  . Anxiety   . Breast mass    left breast mass  . HSV (herpes simplex virus) anogenital infection   . No pertinent past medical history      Social History   Socioeconomic History  . Marital status: Single    Spouse name: Not on file  . Number of children: Not on file  . Years of education: Not on file  . Highest education level: Not on file  Occupational History  . Not on file  Social Needs  . Financial resource strain: Not on file  . Food insecurity:    Worry: Not on file    Inability: Not on file  . Transportation needs:    Medical: Not on file    Non-medical: Not on file  Tobacco Use  . Smoking status: Never Smoker  . Smokeless tobacco: Never Used  Substance and Sexual Activity  . Alcohol use: No  . Drug use: No  . Sexual activity: Yes    Birth control/protection: IUD    Comment: Mirena  Lifestyle  . Physical activity:    Days per  week: Not on file    Minutes per session: Not on file  . Stress: Not on file  Relationships  . Social connections:    Talks on phone: Not on file    Gets together: Not on file    Attends religious service: Not on file    Active member of club or organization: Not on file    Attends meetings of clubs or organizations: Not on file    Relationship status: Not on file  . Intimate partner violence:    Fear of current or ex partner: Not on file    Emotionally abused: Not on file    Physically abused: Not on file    Forced sexual activity: Not on file  Other Topics Concern  . 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 tubes in ears Left 1990    Family History  Problem Relation Age of Onset  . Multiple sclerosis Father   . Diabetes Maternal Grandmother   . Emphysema Maternal Grandmother   . Stroke Paternal Grandmother   . Hypertension Paternal Grandmother     PMHx, SurgHx, SocialHx, FamHx, Medications, and Allergies were reviewed in the Visit Navigator and updated as appropriate.   Patient Active Problem List   Diagnosis Date Noted  . Chest tightness 03/01/2017  . Palpitations 03/01/2017  . POLYCYSTIC OVARIAN DISEASE 08/19/2009  . OBESITY 08/19/2009    Social History   Tobacco Use  . Smoking status: Never Smoker  . Smokeless tobacco: Never Used  Substance Use Topics  . Alcohol use: No  . Drug use: No    Current Medications and Allergies:    Current Outpatient Medications:  .  escitalopram (LEXAPRO) 10 MG tablet, Take 1 tablet (10 mg total) daily by mouth., Disp: 30 tablet, Rfl: 1 .  LORazepam (ATIVAN) 0.5 MG tablet,  Take 1 tablet (0.5 mg total) at bedtime by mouth., Disp: 15 tablet, Rfl: 0 .  Magnesium 250 MG TABS, Take 1 tablet every other day by mouth., Disp: , Rfl:  .  Omega-3 Fatty Acids (OMEGA-3 FISH OIL PO), Take 1 capsule daily by mouth., Disp: , Rfl:  .  omeprazole (PRILOSEC) 20 MG capsule, Take 20 mg by mouth at bedtime., Disp: , Rfl:  .  ondansetron (ZOFRAN) 4 MG tablet, Take 1 tablet (4 mg total) every 8 (eight) hours as needed by mouth for nausea or vomiting., Disp: 20 tablet, Rfl: 0 .  POTASSIUM CHLORIDE PO, Take 1 tablet every other day by mouth., Disp: , Rfl:   No Known Allergies  Review of Systems   ROS  Negative unless otherwise specified per HPI.   Vitals:   Vitals:   07/27/17 0939  BP: 138/72  Pulse: 88  Temp: 98.8 F (37.1 C)  TempSrc: Oral  SpO2: 97%  Weight: 275 lb 12.8 oz (125.1 kg)     Body mass index is 45.9 kg/m.   Physical Exam:    Physical Exam  Constitutional: She appears well-developed. She is cooperative.  Non-toxic appearance. She does not have a sickly appearance. She does not appear ill. No distress.  Cardiovascular: Normal rate, regular rhythm, S1 normal, S2 normal, normal heart sounds and normal pulses.  No LE edema  Pulmonary/Chest: Effort normal and breath sounds normal.  Abdominal: Normal appearance and bowel sounds are normal. There is tenderness in the epigastric area. There is no rigidity, no rebound, no guarding, no CVA tenderness, no tenderness at McBurney's point and negative Murphy's sign.  Neurological: She is alert. GCS eye subscore is 4. GCS verbal subscore is 5. GCS motor subscore is 6.  Skin: Skin is warm, dry and intact.  Psychiatric: She has a normal mood and affect. Her speech is normal and behavior is normal.  Grimacing in pain  Nursing note and vitals reviewed.    Assessment and Plan:    Crystal Salinas was seen today for abdominal pain.  Diagnoses and all orders for this visit:  Lower abdominal pain -     Cancel: CT ABDOMEN  PELVIS W WO CONTRAST; Future -     CT Abdomen Pelvis W Contrast; Future   Given worsening of symptoms, will order STAT CT scan. Advised patient that if she develops any worsening symptoms, she needs to go to the ER. She has a CT scan  scheduled now for completion at [facility]. Intervention based on findings. No red flags on my exam. Patient verbalized understanding.  . Reviewed expectations re: course of current medical issues. . Discussed self-management of symptoms. . Outlined signs and symptoms indicating need for more acute intervention. . Patient verbalized understanding and all questions were answered. . See orders for this visit as documented in the electronic medical record. . Patient received an After Visit Summary.  Jarold Motto, PA-C , [facility] 07/27/2017  Follow-up: No follow-ups on file.

## 2017-07-28 LAB — CERVICOVAGINAL ANCILLARY ONLY
Bacterial vaginitis: NEGATIVE
CANDIDA VAGINITIS: NEGATIVE
Chlamydia: NEGATIVE
NEISSERIA GONORRHEA: NEGATIVE
TRICH (WINDOWPATH): NEGATIVE

## 2017-07-29 ENCOUNTER — Encounter: Payer: Self-pay | Admitting: Advanced Practice Midwife

## 2017-07-29 ENCOUNTER — Ambulatory Visit (INDEPENDENT_AMBULATORY_CARE_PROVIDER_SITE_OTHER): Payer: 59 | Admitting: Advanced Practice Midwife

## 2017-07-29 VITALS — BP 120/84 | HR 72 | Ht 67.0 in | Wt 275.0 lb

## 2017-07-29 DIAGNOSIS — R6889 Other general symptoms and signs: Secondary | ICD-10-CM

## 2017-07-29 DIAGNOSIS — M25552 Pain in left hip: Secondary | ICD-10-CM

## 2017-07-29 DIAGNOSIS — N83201 Unspecified ovarian cyst, right side: Secondary | ICD-10-CM | POA: Diagnosis not present

## 2017-07-29 DIAGNOSIS — Z30432 Encounter for removal of intrauterine contraceptive device: Secondary | ICD-10-CM

## 2017-07-29 MED ORDER — NORGESTIMATE-ETH ESTRADIOL 0.25-35 MG-MCG PO TABS: 1.0000 | ORAL_TABLET | Freq: Every day | ORAL | 11 refills | Status: DC

## 2017-07-29 NOTE — Progress Notes (Addendum)
  Crystal Salinas 37 y.o.  Vitals:   07/29/17 1009  BP: 120/84  Pulse: 72   Past Medical History:  Diagnosis Date  . Anxiety   . Breast mass    left breast mass  . No pertinent past medical history    Past Surgical History:  Procedure Laterality Date  . tubes in ears Left 1990   family history includes Diabetes in her maternal grandmother; Emphysema in her maternal grandmother; Hypertension in her paternal grandmother; Multiple sclerosis in her father; Stroke in her paternal grandmother.  Current Outpatient Medications:  .  LORazepam (ATIVAN) 0.5 MG tablet, Take 1 tablet (0.5 mg total) at bedtime by mouth., Disp: 15 tablet, Rfl: 0 .  Magnesium 250 MG TABS, Take 1 tablet every other day by mouth., Disp: , Rfl:  .  Omega-3 Fatty Acids (OMEGA-3 FISH OIL PO), Take 1 capsule daily by mouth., Disp: , Rfl:  .  omeprazole (PRILOSEC) 20 MG capsule, Take 20 mg by mouth at bedtime., Disp: , Rfl:  .  ondansetron (ZOFRAN) 4 MG tablet, Take 1 tablet (4 mg total) every 8 (eight) hours as needed by mouth for nausea or vomiting., Disp: 20 tablet, Rfl: 0 .  POTASSIUM CHLORIDE PO, Take 1 tablet every other day by mouth., Disp: , Rfl:  .  norgestimate-ethinyl estradiol (ORTHO-CYCLEN,SPRINTEC,PREVIFEM) 0.25-35 MG-MCG tablet, Take 1 tablet by mouth daily., Disp: 1 Package, Rfl: 11    Here for IUD removal.  She had the Mirena IUD placed 5 years ago and would like it removed .Marland Kitchen. Her plans for future contraception are COCs (for PCOS help).  A graves speculum was placed, and the strings were visible.  They were grasped with a curved Tresa EndoKelly and the IUD easily removed.  Pt given IUD removal f/u instructions.  Pt has 3cm  Right ov cyst seen w/CT (W/U for left sided abdo pain that was initially felt as a "tight band all the way around"lower abdomen.  Cyst is not the source of her pain. CT also showed sacroiliitis.  Left iliac crest also exquisitely tender to touch.  As pt started working out in Feb, strongly  suspect pain is MSK.  PCP has recommended a sports medicine consult and I agree. In the mean time, no lower body work outs  Pt also worried she has lupus and/or MS (family histories)  Says her lupus sx are:  Light sensitivity, HA, heat/cold intolerance, joint pain, stomach pain.  Will check lupus panel; discuss need for MRI w/PCP  Orders Placed This Encounter  Procedures  . Lupus anticoagulant panel

## 2017-07-30 ENCOUNTER — Encounter: Payer: Self-pay | Admitting: Physician Assistant

## 2017-07-30 LAB — LUPUS ANTICOAGULANT PANEL
Dilute Viper Venom Time: 43.4 s (ref 0.0–47.0)
PTT LA: 33.2 s (ref 0.0–51.9)

## 2017-08-04 ENCOUNTER — Encounter: Payer: Self-pay | Admitting: Physician Assistant

## 2017-08-06 ENCOUNTER — Encounter: Payer: Self-pay | Admitting: Physician Assistant

## 2017-08-06 ENCOUNTER — Ambulatory Visit: Payer: 59 | Admitting: Physician Assistant

## 2017-08-06 ENCOUNTER — Ambulatory Visit: Payer: 59 | Admitting: Sports Medicine

## 2017-08-06 VITALS — BP 126/70 | HR 85 | Temp 99.3°F | Ht 67.0 in | Wt 275.2 lb

## 2017-08-06 DIAGNOSIS — J069 Acute upper respiratory infection, unspecified: Secondary | ICD-10-CM

## 2017-08-06 MED ORDER — AMOXICILLIN-POT CLAVULANATE 875-125 MG PO TABS: 1.0000 | ORAL_TABLET | Freq: Two times a day (BID) | ORAL | 0 refills | Status: DC

## 2017-08-06 NOTE — Patient Instructions (Signed)
It was great to see you!  You have a viral upper respiratory infection. Antibiotics are not needed for this.  Viral infections usually take 7-10 days to resolve.  The cough can last a few weeks to go away.  Use medication as prescribed: *Augmentin antibiotic, should you need it  Push fluids and get plenty of rest. Please return if you are not improving as expected, or if you have high fevers (>101.5) or difficulty swallowing or worsening productive cough.  Call clinic with questions.  I hope you start feeling better soon!

## 2017-08-06 NOTE — Progress Notes (Signed)
Crystal Salinas is a 37 y.o. female here for a new problem.  I acted as a Neurosurgeon for [employer], PA-C [name], LPN  History of Present Illness:   Chief Complaint  Patient presents with  . Sinus Problem    Sinus Problem  This is a new problem. Episode onset: Started on Tuesday. The problem has been waxing and waning since onset. Maximum temperature: Pt has not measured temp. Her pain is at a severity of 5/10. The pain is moderate. Associated symptoms include chills, congestion (Chest congestion, expectorating green sputum), coughing, ear pain, headaches, neck pain, sinus pressure, a sore throat and swollen glands (Left side). Pertinent negatives include no hoarse voice or shortness of breath. Past treatments include spray decongestants (Mucinex DM, Albuterol nebulizer, Flonase, Ibuprofen).   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 Appetite is good. Felt feverish yesterday.  Past Medical History:  Diagnosis Date  . Anxiety   . Breast mass    left breast mass  . No pertinent past medical history      Social History   Socioeconomic History  . Marital status: Single    Spouse name: Not on file  . Number of children: Not on file  . Years of education: Not on file  . Highest education level: Not on file  Occupational History  . Not on file  Social Needs  . Financial resource strain: Not on file  . Food insecurity:    Worry: Not on file    Inability: Not on file  . Transportation needs:    Medical: Not on file    Non-medical: Not on file  Tobacco Use  . Smoking status: Never Smoker  . Smokeless tobacco: Never Used  Substance and Sexual Activity  . Alcohol use: No  . Drug use: No  . Sexual activity: Yes    Birth control/protection: IUD    Comment: Mirena  Lifestyle  . Physical activity:    Days per week: Not on file    Minutes per session: Not on file  . Stress: Not on file  Relationships  . Social connections:    Talks on phone: Not  on file    Gets together: Not on file    Attends religious service: Not on file    Active member of club or organization: Not on file    Attends meetings of clubs or organizations: Not on file    Relationship status: Not on file  . Intimate partner violence:    Fear of current or ex partner: Not on file    Emotionally abused: Not on file    Physically abused: Not on file    Forced sexual activity: Not on file  Other Topics Concern  . 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 tubes in ears Left 1990    Family History  Problem Relation Age of Onset  . Multiple sclerosis Father   . Diabetes Maternal Grandmother   . Emphysema Maternal Grandmother   . Stroke Paternal Grandmother   . Hypertension Paternal Grandmother  No Known Allergies  Current Medications:   Current Outpatient Medications:  .  LORazepam (ATIVAN) 0.5 MG tablet, Take 1 tablet (0.5 mg total) at bedtime by mouth., Disp: 15 tablet, Rfl: 0 .  Magnesium 250 MG TABS, Take 1 tablet every other day by mouth., Disp: , Rfl:  .  norgestimate-ethinyl estradiol (ORTHO-CYCLEN,SPRINTEC,PREVIFEM) 0.25-35 MG-MCG tablet, Take 1 tablet by mouth daily., Disp: 1 Package, Rfl: 11 .  Omega-3 Fatty Acids (OMEGA-3 FISH OIL PO), Take 1 capsule daily by mouth., Disp: , Rfl:  .  omeprazole (PRILOSEC) 20 MG capsule, Take 20 mg by mouth at bedtime., Disp: , Rfl:  .  ondansetron (ZOFRAN) 4 MG tablet, Take 1 tablet (4 mg total) every 8 (eight) hours as needed by mouth for nausea or vomiting., Disp: 20 tablet, Rfl: 0 .  POTASSIUM CHLORIDE PO, Take 1 tablet every other day by mouth., Disp: , Rfl:   .  amoxicillin-clavulanate (AUGMENTIN) 875-125 MG tablet, Take 1 tablet by mouth 2 (two) times daily., Disp: 20 tablet, Rfl: 0   Review of Systems:   Review of Systems  Constitutional: Positive for chills.  HENT: Positive for congestion (Chest congestion, expectorating green sputum), ear pain, sinus pressure and sore throat. Negative for hoarse voice.   Respiratory: Positive for cough. Negative for shortness of breath.   Musculoskeletal: Positive for neck pain.  Neurological: Positive for headaches.    Vitals:   Vitals:   08/06/17 1058  BP: 126/70  Pulse: 85  Temp: 99.3 F (37.4 C)  TempSrc: Oral  SpO2: 97%  Weight: 275 lb 4 oz (124.9 kg)  Height:  (1.702 m)     Body mass index is 43.11 kg/m.  Physical Exam:   Physical Exam  Constitutional: She appears well-developed. She is cooperative.  Non-toxic appearance. She does not have a sickly appearance. She does not appear ill. No distress.  HENT:  Head: Normocephalic and atraumatic.  Right Ear: Tympanic membrane, external ear and ear canal normal. Tympanic membrane is not erythematous, not retracted and not bulging.  Left Ear: Tympanic membrane, external ear and ear canal normal. Tympanic membrane is not erythematous, not retracted and not bulging.  Nose: Mucosal edema and rhinorrhea present. Right sinus exhibits maxillary sinus tenderness. Right sinus exhibits no frontal sinus tenderness. Left sinus exhibits maxillary sinus tenderness. Left sinus exhibits no frontal sinus tenderness.  Mouth/Throat: Uvula is midline and mucous membranes are normal. Posterior oropharyngeal erythema present. No posterior oropharyngeal edema. Tonsils are 1+ on the right. Tonsils are 1+ on the left. No tonsillar exudate.  Eyes: Conjunctivae and lids are normal.  Neck: Trachea normal.  Cardiovascular: Normal rate, regular rhythm, S1 normal, S2 normal and normal heart sounds.  Pulmonary/Chest: Effort normal and breath sounds normal. She has no  decreased breath sounds. She has no wheezes. She has no rhonchi. She has no rales.  Lymphadenopathy:    She has no cervical adenopathy.  Neurological: She is alert.  Skin: Skin is warm, dry and intact.  Psychiatric: She has a normal mood and affect. Her speech is normal and behavior is normal.  Nursing note and vitals reviewed.   Assessment and Plan:    Sonna was seen today for sinus problem.  Diagnoses and all orders for this visit:  Upper respiratory tract infection, unspecified type  Other orders -     amoxicillin-clavulanate (AUGMENTIN) 875-125 MG tablet; Take 1 tablet by mouth 2 (two) times daily.   No red flags on exam.  Discussed pushing fluids and using mucinex.  Did provide pocket prescription of Augmentin should symptoms not improve or worsen with symptomatic care. Discussed taking medications as prescribed. Reviewed return precautions including worsening fever, SOB, worsening cough or other concerns. Push fluids and rest. I recommend that patient follow-up if symptoms worsen or persist despite treatment x 7-10 days, sooner if needed.   . Reviewed expectations re: course of current medical issues. . Discussed self-management of symptoms. . Outlined signs and symptoms indicating need for more acute intervention. . Patient verbalized understanding and all questions were answered. . See orders for this visit as documented in the electronic medical record. . Patient received an After-Visit Summary.  CMA or LPN served as scribe during this visit. History, Physical, and Plan performed by medical provider. Documentation and orders reviewed and attested to.  Jarold Motto, PA-C

## 2017-08-09 ENCOUNTER — Ambulatory Visit: Payer: Self-pay | Admitting: *Deleted

## 2017-08-09 ENCOUNTER — Encounter (HOSPITAL_COMMUNITY): Payer: Self-pay

## 2017-08-09 ENCOUNTER — Emergency Department (HOSPITAL_COMMUNITY): Payer: 59

## 2017-08-09 ENCOUNTER — Telehealth: Payer: Self-pay | Admitting: *Deleted

## 2017-08-09 ENCOUNTER — Emergency Department (HOSPITAL_COMMUNITY)
Admission: EM | Admit: 2017-08-09 | Discharge: 2017-08-10 | Disposition: A | Discharge status: Home / Self Care | Payer: 59 | Attending: Emergency Medicine | Admitting: Emergency Medicine

## 2017-08-09 DIAGNOSIS — R05 Cough: Secondary | ICD-10-CM | POA: Diagnosis not present

## 2017-08-09 DIAGNOSIS — Z5321 Procedure and treatment not carried out due to patient leaving prior to being seen by health care provider: Secondary | ICD-10-CM | POA: Insufficient documentation

## 2017-08-09 DIAGNOSIS — R079 Chest pain, unspecified: Secondary | ICD-10-CM | POA: Insufficient documentation

## 2017-08-09 LAB — CBC
HEMATOCRIT: 39 % (ref 36.0–46.0)
Hemoglobin: 12.5 g/dL (ref 12.0–15.0)
MCH: 25.6 pg — ABNORMAL LOW (ref 26.0–34.0)
MCHC: 32.1 g/dL (ref 30.0–36.0)
MCV: 79.9 fL (ref 78.0–100.0)
Platelets: 442 10*3/uL — ABNORMAL HIGH (ref 150–400)
RBC: 4.88 MIL/uL (ref 3.87–5.11)
RDW: 14.6 % (ref 11.5–15.5)
WBC: 10.7 10*3/uL — ABNORMAL HIGH (ref 4.0–10.5)

## 2017-08-09 LAB — I-STAT BETA HCG BLOOD, ED (MC, WL, AP ONLY): I-stat hCG, quantitative: <5 m[IU]/mL (ref <5)

## 2017-08-09 LAB — BASIC METABOLIC PANEL
ANION GAP: 11 (ref 5–15)
BUN: 12 mg/dL (ref 6–20)
CHLORIDE: 102 mmol/L (ref 101–111)
CO2: 27 mmol/L (ref 22–32)
CREATININE: 1.04 mg/dL — AB (ref 0.44–1.00)
Calcium: 9.4 mg/dL (ref 8.9–10.3)
GLUCOSE: 102 mg/dL — AB (ref 65–99)
Potassium: 3.9 mmol/L (ref 3.5–5.1)
SODIUM: 140 mmol/L (ref 135–145)

## 2017-08-09 LAB — I-STAT TROPONIN, ED: Troponin i, poc: 0.01 ng/mL (ref 0.00–0.08)

## 2017-08-09 NOTE — ED Triage Notes (Signed)
Pt states that she began having CP last night that comes and goes, has a massage on Friday and since had bilateral numbness in both arms that comes and goes. Some neck pain and headache, denies dizziness. Coughing up green sputum, on antibiotics.

## 2017-08-09 NOTE — Telephone Encounter (Signed)
Friday patient got hot stone massage- patient felt a lot of pressure- and she had back ache next day. She did not drink and hydrate. Patient had fluid expelled from her back and nerve twitches in her arms. Patient has not felt right since she had the massage. Patient had CT scan and she states she was diagnosed with ovarian cyst and bilateral sarcolitis.Patient had that part of her body massaged. Sports medicine providers are not in today- patient is working until 7pm today- she is going to go to ED after work to get checked out.  Reason for Disposition . Back pain  (and neurologic deficit)  Answer Assessment - Initial Assessment Questions 1. SYMPTOM: "What is the main symptom you are concerned about?" (e.g., weakness, numbness)     Headache at base of neck - left 2. ONSET: "When did this start?" (minutes, hours, days; while sleeping)     Hot stone massage 3. LAST NORMAL: "When was the last time you were normal (no symptoms)?"     Before massage 4. PATTERN "Does this come and go, or has it been constant since it started?"  "Is it present now?"     Constant- patient states Ibuprofen relieves symptoms- but it comes back at night 5. CARDIAC SYMPTOMS: "Have you had any of the following symptoms: chest pain, difficulty breathing, palpitations?"     no 6. NEUROLOGIC SYMPTOMS: "Have you had any of the following symptoms: headache, dizziness, vision loss, double vision, changes in speech, unsteady on your feet?"     headaches 7. OTHER SYMPTOMS: "Do you have any other symptoms?"     fluid coming out of skin after massage- patient has tried to rehydrate 8. PREGNANCY: "Is there any chance you are pregnant?" "When was your last menstrual period?"     IUD remover 07/29/17  Protocols used: NEUROLOGIC DEFICIT-A-AH

## 2017-08-09 NOTE — Telephone Encounter (Signed)
Fax from [facility]: Caller states she got a hot stone massage on Friday. She has headache, numbness in arms, chest discomfort, dehydrated, liquid coming from her back and back pain. Patient was instructed to go to ED now. Patient declined to go to the ED. 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   From reviewing her chart I do not see a hospital visit. I attempted to call patient and left a voicemail requesting call back.

## 2017-08-10 ENCOUNTER — Encounter: Payer: Self-pay | Admitting: Physician Assistant

## 2017-08-10 ENCOUNTER — Ambulatory Visit: Payer: 59 | Admitting: Physician Assistant

## 2017-08-10 ENCOUNTER — Telehealth: Payer: Self-pay | Admitting: *Deleted

## 2017-08-10 VITALS — BP 100/62 | HR 61 | Ht 67.0 in | Wt 269.5 lb

## 2017-08-10 DIAGNOSIS — K219 Gastro-esophageal reflux disease without esophagitis: Secondary | ICD-10-CM | POA: Diagnosis not present

## 2017-08-10 DIAGNOSIS — F418 Other specified anxiety disorders: Secondary | ICD-10-CM

## 2017-08-10 DIAGNOSIS — Z79899 Other long term (current) drug therapy: Secondary | ICD-10-CM | POA: Diagnosis not present

## 2017-08-10 DIAGNOSIS — R7989 Other specified abnormal findings of blood chemistry: Secondary | ICD-10-CM

## 2017-08-10 DIAGNOSIS — R202 Paresthesia of skin: Secondary | ICD-10-CM | POA: Diagnosis not present

## 2017-08-10 DIAGNOSIS — R079 Chest pain, unspecified: Secondary | ICD-10-CM | POA: Diagnosis not present

## 2017-08-10 DIAGNOSIS — Z5321 Procedure and treatment not carried out due to patient leaving prior to being seen by health care provider: Secondary | ICD-10-CM | POA: Diagnosis not present

## 2017-08-10 MED ORDER — RANITIDINE HCL 150 MG PO TABS: 150.0000 mg | ORAL_TABLET | Freq: Two times a day (BID) | ORAL | 1 refills | Status: DC

## 2017-08-10 NOTE — Telephone Encounter (Signed)
Late entry-Received fax from [facility] Time: 6:20AM: Caller states she was seen in the ER last night. She got there at 1930 and jut left now without being seen due to ER being busy and holding patients. She was there for chest discomfort, had EKG and blood work. She states her troponin was 0.01 and EKG was normal. She states her symptoms are not worse then they were while at the ER. She is requesting to have this message to [facility] and a call back if possible to let her know what she should do since she did not stay at the hospital untils she was seen. She declined triage.

## 2017-08-10 NOTE — ED Notes (Signed)
Patient did not answer when called to re-assess vital signs.

## 2017-08-10 NOTE — Patient Instructions (Addendum)
Continue 10 mg Lexapro daily.  Discontinue your Nexium. Start Zantac 150 mg twice a day (take 30 min before meal). After one month, we will try to decrease to 150 mg.  We will contact you with your lab results.  Follow-up in 1 month, sooner if needed.

## 2017-08-10 NOTE — Telephone Encounter (Signed)
Pt called back and scheduled an appt today with [facility].

## 2017-08-10 NOTE — Telephone Encounter (Signed)
Left detailed message on voicemail we received your message please call office and schedule an appt with [facility].

## 2017-08-10 NOTE — Progress Notes (Signed)
ER visit follow up appointment.  I have reviewed the intake provided by my nurse or medical assistant today. I have reviewed the prior telephone transitional care contact documented in the patient's medical record.  I acted as a Neurosurgeon for [employer], PA-C [employer], LPN  SUBJECTIVE    Crystal Salinas is a 37 y.o. female here today for transitional care hospital follow up visit:  Patient reports that since her hot stone massage she had last week she has had hypersensitivity.  She has been able to feel her heartbeat more than normal and has had some intermittent twinges of pain in her lower legs and chest.  She went to the emergency department yesterday and told that she had chest pain.  An EKG and troponins were performed and were negative.  Since that time, she has had intermittent nausea and has been drinking water and pedialyte. She is still on the antibiotic from her URI, Augmentin. Last time she ate something was yesterday in ER (ate a cracker and peanut butter around 12 hours ago.) Is having softer stools with the antibiotic, but not diarrhea. Urine is clear to yellow, not dark. Continues to have nausea. Has been taking Zofran.  She is down about 6 pounds since I last saw her.  She does endorse anxiety.  She states that she decided to take a 10 mg Lexapro last night because she was having anxiety.  This is the first time that she has taken this medication in months.   She also reports that since her massage she has had some sensitivity to her right middle back, at one point she felt some liquid sensation dripping from her back, but this has resolved.  She also endorses GERD.  She is on Nexium for this per her report.  She states that she takes it on empty stomach.  This was contributing to her chest pain yesterday.  It is intermittent.  Labs in the ED did reveal a slight increase in her creatinine.   GAD 7 : Generalized Anxiety Score 08/10/2017 02/19/2017 02/12/2017  Nervous,  Anxious, on Edge Control/stop worrying Worry too much - different things Trouble relaxing Restless 1 0 0  Easily annoyed or irritable 1 0 0  Afraid - awful might happen Total GAD 7 Score Anxiety Difficulty Somewhat difficult Somewhat difficult -     Wt Readings from Last 5 Encounters:  08/10/17 269 lb 8 oz (122.2 kg)  08/09/17 271 lb (122.9 kg)  08/06/17 275 lb 4 oz (124.9 kg)  07/29/17 275 lb (124.7 kg)  07/27/17 275 lb 12.8 oz (125.1 kg)       Objective:    Vitals:   08/10/17 1501  BP: 100/62  Pulse: 61  SpO2: 96%      General: Alert, cooperative, appears stated age and no distress.  HEENT:  Normocephalic, without obvious abnormality, atraumatic. Conjunctivae/corneas clear. PERRL, EOM's intact. Normal TM's and external ear canals both ears. Nares normal. Septum midline. Mucosa normal. No drainage or sinus tenderness. Lips, mucosa, and tongue normal; teeth and gums normal.  Lungs: Clear to auscultation bilaterally.  Heart:: Regular rate and rhythm, S1, S2 normal, no murmur, click, rub or gallop.  Abdomen: Soft, non-tender; bowel sounds normal; no masses,  no organomegaly.  Extremities: Extremities normal, atraumatic, no cyanosis or edema.  Pulses: 2+ and symmetric.  Skin: Skin color, texture,  turgor normal. No rashes or lesions.  Neurologic: Alert and oriented X 3, normal strength and tone. Normal symmetric. reflexes. Normal coordination and gait.  Psych: Alert,oriented, in NAD with a full range of affect, normal behavior and no psychotic features       Assessment/Plan:   Nicollette was seen today for ER follow up.  Diagnoses and all orders for this visit:  Encounter for long-term (current) use of medications; Tingling in extremities Exam benign. No red flags on neuro exam. Will check B12 given prolonged use of GERD medications as well as intermittent tingling sensation patient is experiencing. -     Vitamin  B12  Elevated serum creatinine Will recheck creatinine; work on hydration.  Avoid nephrotoxic agents. -     Basic metabolic panel  Anxiety about health Her GAD-7 is 15 today I recommended that she continue to take her Lexapro.  Patient is agreeable to plan.  I want her to follow-up with me in 1 month.  GERD I recommended that we try a different regimen for her GERD.  I would like her to discontinue her Nexium.  Start 150 mg ranitidine twice a day.  In 1 month we will reevaluate if we can decrease to once a day.  Other orders -     ranitidine (ZANTAC) 150 MG tablet; Take 1 tablet (150 mg total) by mouth 2 (two) times daily.      CMA or LPN served as scribe during this visit. History, Physical, and Plan performed by medical provider. Documentation and orders reviewed and attested to.  Jarold Motto, PA-C

## 2017-08-10 NOTE — ED Notes (Signed)
Patient called again to re-assess vital signs with no answer.

## 2017-08-11 ENCOUNTER — Other Ambulatory Visit (INDEPENDENT_AMBULATORY_CARE_PROVIDER_SITE_OTHER): Payer: 59

## 2017-08-11 ENCOUNTER — Other Ambulatory Visit: Payer: Self-pay | Admitting: Physician Assistant

## 2017-08-11 ENCOUNTER — Other Ambulatory Visit: Payer: Self-pay | Admitting: Gastroenterology

## 2017-08-11 ENCOUNTER — Encounter: Payer: Self-pay | Admitting: Physician Assistant

## 2017-08-11 ENCOUNTER — Ambulatory Visit (INDEPENDENT_AMBULATORY_CARE_PROVIDER_SITE_OTHER): Payer: 59

## 2017-08-11 DIAGNOSIS — R202 Paresthesia of skin: Secondary | ICD-10-CM

## 2017-08-11 DIAGNOSIS — E559 Vitamin D deficiency, unspecified: Secondary | ICD-10-CM

## 2017-08-11 DIAGNOSIS — E538 Deficiency of other specified B group vitamins: Secondary | ICD-10-CM

## 2017-08-11 LAB — BASIC METABOLIC PANEL
BUN: 10 mg/dL (ref 6–23)
CHLORIDE: 99 meq/L (ref 96–112)
CO2: 28 mEq/L (ref 19–32)
Calcium: 9.6 mg/dL (ref 8.4–10.5)
Creatinine, Ser: 0.75 mg/dL (ref 0.40–1.20)
GFR: 110.75 mL/min (ref >60.00)
Glucose, Bld: 76 mg/dL (ref 70–99)
Potassium: 4.2 mEq/L (ref 3.5–5.1)
Sodium: 136 mEq/L (ref 135–145)

## 2017-08-11 LAB — VITAMIN D 25 HYDROXY (VIT D DEFICIENCY, FRACTURES): VITD: 24.51 ng/mL — ABNORMAL LOW (ref 30.00–100.00)

## 2017-08-11 LAB — VITAMIN B12: Vitamin B-12: 274 pg/mL (ref 211–911)

## 2017-08-11 MED ORDER — CYANOCOBALAMIN 1000 MCG/ML IJ SOLN
1000.0000 ug | Freq: Once | INTRAMUSCULAR | Status: AC
  Administered 2017-08-11: 1000 ug via INTRAMUSCULAR

## 2017-08-11 NOTE — Progress Notes (Signed)
Cyanocobalamin 1040mcg/mL, 1 mL given IM, left deltoid, mfg: [employer], lot: [id number], exp: SEP20, ndc: 0517-0031-25, pt tolerated well.

## 2017-08-12 ENCOUNTER — Encounter: Payer: Self-pay | Admitting: Neurology

## 2017-08-12 ENCOUNTER — Ambulatory Visit: Payer: 59 | Admitting: Sports Medicine

## 2017-08-16 ENCOUNTER — Encounter: Payer: Self-pay | Admitting: Physician Assistant

## 2017-08-17 ENCOUNTER — Other Ambulatory Visit (HOSPITAL_COMMUNITY)
Admission: RE | Admit: 2017-08-17 | Discharge: 2017-08-17 | Disposition: A | Discharge status: Home / Self Care | Payer: 59 | Source: Ambulatory Visit | Attending: Physician Assistant | Admitting: Physician Assistant

## 2017-08-17 ENCOUNTER — Ambulatory Visit: Payer: 59 | Admitting: Sports Medicine

## 2017-08-17 ENCOUNTER — Encounter: Payer: Self-pay | Admitting: Physician Assistant

## 2017-08-17 ENCOUNTER — Ambulatory Visit: Payer: 59 | Admitting: Physician Assistant

## 2017-08-17 ENCOUNTER — Encounter: Payer: Self-pay | Admitting: Sports Medicine

## 2017-08-17 VITALS — BP 110/74 | HR 84 | Ht 67.0 in | Wt 273.0 lb

## 2017-08-17 VITALS — BP 110/74 | HR 84 | Temp 98.7°F | Ht 67.0 in | Wt 273.0 lb

## 2017-08-17 DIAGNOSIS — Z113 Encounter for screening for infections with a predominantly sexual mode of transmission: Secondary | ICD-10-CM | POA: Insufficient documentation

## 2017-08-17 DIAGNOSIS — R29898 Other symptoms and signs involving the musculoskeletal system: Secondary | ICD-10-CM | POA: Diagnosis not present

## 2017-08-17 DIAGNOSIS — M545 Low back pain: Secondary | ICD-10-CM | POA: Diagnosis not present

## 2017-08-17 DIAGNOSIS — G8929 Other chronic pain: Secondary | ICD-10-CM

## 2017-08-17 DIAGNOSIS — N898 Other specified noninflammatory disorders of vagina: Secondary | ICD-10-CM | POA: Diagnosis not present

## 2017-08-17 DIAGNOSIS — E538 Deficiency of other specified B group vitamins: Secondary | ICD-10-CM | POA: Diagnosis not present

## 2017-08-17 DIAGNOSIS — M24552 Contracture, left hip: Secondary | ICD-10-CM | POA: Diagnosis not present

## 2017-08-17 LAB — POCT URINALYSIS DIPSTICK
BILIRUBIN UA: NEGATIVE
Glucose, UA: NEGATIVE
Ketones, UA: NEGATIVE
LEUKOCYTES UA: NEGATIVE
Nitrite, UA: NEGATIVE
PH UA: 7 (ref 5.0–8.0)
PROTEIN UA: NEGATIVE
RBC UA: NEGATIVE
SPEC GRAV UA: 1.02 (ref 1.010–1.025)
UROBILINOGEN UA: 0.2 U/dL

## 2017-08-17 MED ORDER — NYSTATIN 100000 UNIT/GM EX CREA: 1.0000 "application " | TOPICAL_CREAM | Freq: Two times a day (BID) | CUTANEOUS | 0 refills | Status: DC

## 2017-08-17 MED ORDER — CYANOCOBALAMIN 1000 MCG/ML IJ SOLN
1000.0000 ug | Freq: Once | INTRAMUSCULAR | Status: AC
  Administered 2017-08-17: 1000 ug via INTRAMUSCULAR

## 2017-08-17 MED ORDER — FLUCONAZOLE 150 MG PO TABS: 150.0000 mg | ORAL_TABLET | Freq: Once | ORAL | 0 refills | Status: AC

## 2017-08-17 NOTE — Progress Notes (Signed)
PROCEDURE NOTE : OSTEOPATHIC MANIPULATION The decision today to treat with Osteopathic Manipulative Therapy (OMT) was based on physical exam findings. Verbal consent was obtained following a discussion with the patient regarding the of risks, benefits and potential side effects, including an acute pain flare,post manipulation soreness and need for repeat treatments.     NONE  Manipulation was performed as below: Regions treated: Pelvis OMT Techniques Used: HVLA, muscle energy and myofascial release  The patient tolerated the treatment well and reported Improved symptoms following treatment today. Patient was given medications, exercises, stretches and lifestyle modifications per AVS and verbally.   OSTEOPATHIC/STRUCTURAL EXAM:   Left psoas spasm Left anterior innonimate

## 2017-08-17 NOTE — Progress Notes (Signed)
Crystal Salinas is a 37 y.o. female here for a new problem.  I acted as a Neurosurgeon for [employer], PA-C [name], LPN  History of Present Illness:   Chief Complaint  Patient presents with  . Vaginal Itching    Vaginal Itching  The patient's primary symptoms include genital itching and a genital rash. The patient's pertinent negatives include no genital lesions, genital odor, missed menses, pelvic pain, vaginal bleeding or vaginal discharge. This is a new problem. Episode onset: Started over the weekend. The problem occurs constantly. The problem has been gradually worsening. The pain is mild. The problem affects both sides. She is not pregnant. Pertinent negatives include no chills, constipation, diarrhea, discolored urine, dysuria, fever, flank pain, frequency, hematuria, nausea, painful intercourse or urgency. Nothing aggravates the symptoms. She has tried nothing for the symptoms. She is sexually active. No, her partner does not have an STD. She uses oral contraceptives for contraception. Her menstrual history has been regular. Her past medical history is significant for ovarian cysts.   She recently completed her Augmentin course and feels like this might have caused her symptoms. She has never had a yeast infection before.   Past Medical History:  Diagnosis Date  . Anxiety   . Breast mass    left breast mass     Social History   Socioeconomic History  . Marital status: Single    Spouse name: Not on file  . Number of children: Not on file  . Years of education: Not on file  . Highest education level: Not on file  Occupational History  . Not on file  Social Needs  . Financial resource strain: Not on file  . Food insecurity:    Worry: Not on file    Inability: Not on file  . Transportation needs:    Medical: Not on file    Non-medical: Not on file  Tobacco Use  . Smoking status: Never Smoker  . Smokeless tobacco: Never Used  Substance and Sexual Activity  .  Alcohol use: No  . Drug use: No  . Sexual activity: Yes    Birth control/protection: IUD    Comment: Mirena  Lifestyle  . Physical activity:    Days per week: Not on file    Minutes per session: Not on file  . Stress: Not on file  Relationships  . Social connections:    Talks on phone: Not on file    Gets together: Not on file    Attends religious service: Not on file    Active member of club or organization: Not on file    Attends meetings of clubs or organizations: Not on file    Relationship status: Not on file  . Intimate partner violence:    Fear of current or ex partner: Not on file    Emotionally abused: Not on file    Physically abused: Not on file    Forced sexual activity: Not on file  Other Topics Concern  . 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 tubes in ears  Left 1990    Family History  Problem Relation Age of Onset  . Multiple sclerosis Father   . Diabetes Maternal Grandmother   . Emphysema Maternal Grandmother   . Stroke Paternal Grandmother   . Hypertension Paternal Grandmother     No Known Allergies  Current Medications:   Current Outpatient Medications:  .  escitalopram (LEXAPRO) 10 MG tablet, Take 10 mg by mouth at bedtime., Disp: , Rfl:  .  LORazepam (ATIVAN) 0.5 MG tablet, Take 1 tablet (0.5 mg total) at bedtime by mouth., Disp: 15 tablet, Rfl: 0 .  Magnesium 250 MG TABS, Take 1 tablet every other day by mouth., Disp: , Rfl:  .  norgestimate-ethinyl estradiol (ORTHO-CYCLEN,SPRINTEC,PREVIFEM) 0.25-35 MG-MCG tablet, Take 1 tablet by mouth daily. (Patient taking differently: Take 1 tablet by mouth  at bedtime. ), Disp: 1 Package, Rfl: 11 .  Omega-3 Fatty Acids (OMEGA-3 FISH OIL PO), Take 1 capsule daily by mouth., Disp: , Rfl:  .  omeprazole (PRILOSEC) 20 MG capsule, Take 20 mg by mouth at bedtime., Disp: , Rfl:  .  ondansetron (ZOFRAN) 4 MG tablet, TAKE 1 TABLET EVERY 8 HOURS AS NEEDED FOR NAUSEA & VOMITING, Disp: 20 tablet, Rfl: 0 .  POTASSIUM CHLORIDE PO, Take 1 tablet every other day by mouth., Disp: , Rfl:  .  ranitidine (ZANTAC) 150 MG tablet, Take 1 tablet (150 mg total) by mouth 2 (two) times daily., Disp: 60 tablet, Rfl: 1 .  amoxicillin-clavulanate (AUGMENTIN) 875-125 MG tablet, Take 1 tablet by mouth 2 (two) times daily. (Patient not taking: Reported on 08/17/2017), Disp: 20 tablet, Rfl: 0 .  fluconazole (DIFLUCAN) 150 MG tablet, Take 1 tablet (150 mg total) by mouth once for 1 dose., Disp: 1 tablet, Rfl: 0 .  nystatin cream (MYCOSTATIN), Apply 1 application topically 2 (two) times daily., Disp: 30 g, Rfl: 0   Review of Systems:   Review of Systems  Constitutional: Negative for chills and fever.  Gastrointestinal: Negative for constipation, diarrhea and nausea.  Genitourinary: Negative for dysuria, flank pain, frequency, hematuria, missed menses, pelvic pain, urgency and vaginal discharge.    Vitals:   Vitals:   08/17/17 1019  BP: 110/74  Pulse: 84  Temp: 98.7 F (37.1 C)  TempSrc: Oral  SpO2: 97%  Weight: 273 lb (123.8 kg)  Height:  (1.702 m)     Body mass index is 42.76 kg/m.  Physical Exam:   Physical Exam  Constitutional: She appears well-developed. She is cooperative.  Non-toxic appearance. She does not have a sickly appearance. She does not appear ill. No distress.  Cardiovascular: Normal rate, regular rhythm, S1 normal, S2 normal, normal heart sounds and normal pulses.  No LE edema  Pulmonary/Chest: Effort normal and breath sounds normal.  Genitourinary: Vagina normal. There is no rash, tenderness or lesion on the right labia. There is no rash,  tenderness or lesion on the left labia. Cervix exhibits no motion tenderness and no discharge. No tenderness in the vagina. No vaginal discharge found.  Genitourinary Comments: No visible bumps or lesions. Very small cyst-like nodule palpated at 8 o'clock without no superficial visual skin changes. No tenderness with palpation. Does not appear infectious.  Neurological: She is alert. GCS eye subscore is 4. GCS verbal subscore is 5. GCS motor subscore is 6.  Skin: Skin is warm, dry and intact.  Psychiatric: She has a normal mood and affect. Her speech is normal and behavior is normal.  Nursing note and vitals reviewed.  Results for orders  placed or performed in visit on 08/17/17  POCT urinalysis dipstick  Result Value Ref Range   Color, UA Yellow    Clarity, UA Clear    Glucose, UA Negative    Bilirubin, UA Negative    Ketones, UA Negative    Spec Grav, UA 1.020 1.010 - 1.025   Blood, UA Negative    pH, UA 7.0 5.0 - 8.0   Protein, UA Negative    Urobilinogen, UA 0.2 0.2 or 1.0 E.U./dL   Nitrite, UA Negative    Leukocytes, UA Negative Negative   Appearance     Odor       Assessment and Plan:    Crystal Salinas was seen today for vaginal itching.  Diagnoses and all orders for this visit:  Vaginal itching -     POCT urinalysis dipstick -     Cervicovaginal ancillary only  Screening examination for STD (sexually transmitted disease) -     Cervicovaginal ancillary only  Other orders -     fluconazole (DIFLUCAN) 150 MG tablet; Take 1 tablet (150 mg total) by mouth once for 1 dose. -     nystatin cream (MYCOSTATIN); Apply 1 application topically 2 (two) times daily.   Urinalysis negative. No red signs on exam -- no evidence of infection. She did agree to STI screening today. I will empirically treat for yeast infection with diflucan tablet. I have also prescribed nystatin cream that she can apply to her labia should itching persist despite treatment. I recommended following up with  ob-gyn if symptoms worsen or persist.  . Reviewed expectations re: course of current medical issues. . Discussed self-management of symptoms. . Outlined signs and symptoms indicating need for more acute intervention. . Patient verbalized understanding and all questions were answered. . See orders for this visit as documented in the electronic medical record. . Patient received an After-Visit Summary.  CMA or LPN served as scribe during this visit. History, Physical, and Plan performed by medical provider. Documentation and orders reviewed and attested to.  Jarold Motto, PA-C

## 2017-08-17 NOTE — Progress Notes (Signed)
PROCEDURE NOTE: THERAPEUTIC EXERCISES (97110) 15 minutes spent for Therapeutic exercises as below and as referenced in the AVS.  This included exercises focusing on stretching, strengthening, with significant focus on eccentric aspects.   Proper technique shown and discussed handout in great detail with ATC.  All questions were discussed and answered.   Long term goals include an improvement in range of motion, strength, endurance as well as avoiding reinjury. Frequency of visits is one time as determined during today's  office visit. Frequency of exercises to be performed is as per handout.  EXERCISES REVIEWED:  Pelvic recruitment  Goodman Exercises  Hip ABduction strengthening with focus on Glute Medius Recruitment 

## 2017-08-17 NOTE — Progress Notes (Signed)
[facility] at [facility] [phone]  JOSEPHENE MARRONE - 37 y.o. female MRN [id number]  Date of birth: October 13, 1980  Visit Date: 08/17/2017  PCP: Jarold Motto, PA   Referred by: Jarold Motto, [place]  Scribe for today's visit: Christoper Fabian, LAT, ATC     SUBJECTIVE:  Crystal Salinas is here for New Patient (Initial Visit) (back and hip pain) .   Her back and hip pain symptoms INITIALLY: Began about a month ago with no MOI.  Sam ordered a CT that revealed sacroiliac inflammation as well as a cyst on her R ovary.  However, her symptoms have resolved Described as no pain currently, nonradiating Worsened with unknown Improved with heat Additional associated symptoms include: intermittent N/T in B LEs; no radiating pain into the B LEs    At this time symptoms are improving compared to onset w/ no pain noted currently. She has been taking IBU or Tylenol prn when her symptoms are flared up.  Notes that she had been getting back into the gym in Feb 2019 but then started having some other issues and has not returned to the gym.  Pt is a Engineer, civil (consulting) and works 3, 12-hour shifts  ROS Denies night time disturbances. Reports fevers, chills, or night sweats.  Yes to night sweats Reports unexplained weight loss. Denies personal history of cancer. Reports changes in bowel or bladder habits. Denies recent unreported falls. Denies new or worsening dyspnea or wheezing. Reports headaches or dizziness. Yes to headaches. Denies numbness, tingling or weakness  In the extremities - in B LEs and hands Denies dizziness or presyncopal episodes Denies lower extremity edema    HISTORY & PERTINENT PRIOR DATA:  Prior History reviewed and updated per electronic medical record.  Significant/pertinent history, findings, studies include:  reports that she has never smoked. She has never used smokeless tobacco. Recent Labs    02/12/17 1023  HGBA1C 5.9    No specialty comments available. No problems updated.  OBJECTIVE:  VS:  HT:5\' 7"  (170.2 cm)   WT:273 lb (123.8 kg)  BMI:42.75    BP:110/74  HR:84bpm  TEMP: ( )  RESP:97 %   PHYSICAL EXAM: Constitutional: WDWN, Non-toxic appearing. Psychiatric: Alert & appropriately interactive.  Not depressed or anxious appearing. Respiratory: No increased work of breathing.  Trachea Midline Eyes: Pupils are equal.  EOM intact without nystagmus.  No scleral icterus  Vascular Exam: warm to touch no edema  lower extremity neuro exam: unremarkable normal strength normal sensation normal reflexes  MSK Exam: Bilateral lower extremities overall well aligned.  She has good internal and external rotation of her bilateral hips.  She has negative straight leg raise and lower extremity strength is slightly diminished with hip abduction but no myotomal weakness appreciated.  Paraspinal muscle spasms are mild at this time.  She does have some quadratus lumborum tenderness on the left greater than right.  Slight genu valgus baseline.   ASSESSMENT & PLAN:   1. Chronic bilateral low back pain without sciatica   2. Left hip flexor tightness   3. Weakness of left hip   4. Vitamin B12 deficiency     PLAN: She does have some numbness and tingling that she is reporting that is involving her entire body in both hands and feet.  She is scheduled to see neurology in the upcoming weeks and we will defer to their expertise regarding further evaluation of this.  We did discuss some recommendations  for returning to activity in the gym which is what she would like to get back to and did discuss appropriate titration of exercise/activity levels.  Osteopathic manipulation was performed today based on physical exam findings.  Please see procedure note for further information including Osteopathic Exam findings  Discussed the foundation of treatment for this condition is physical therapy and/or daily (5-6 days/week)  therapeutic exercises, focusing on core strengthening, coordination, neuromuscular control/reeducation.  Therapeutic exercises prescribed per procedure note.  Links to [employer] provided today per Patient Instructions.  These exercises were developed by Myles Lipps, DC with a strong emphasis on core neuromuscular reducation and postural realignment through body-weight exercises.     Follow-up: Return if symptoms worsen or fail to improve.     Please see additional documentation for Objective, Assessment and Plan sections. Pertinent additional documentation may be included in corresponding procedure notes, imaging studies, problem based documentation and patient instructions. Please see these sections of the encounter for additional information regarding this visit.  CMA/ATC served as Neurosurgeon during this visit. History, Physical, and Plan performed by medical provider. Documentation and orders reviewed and attested to.      Andrena Mews, DO    [facility]

## 2017-08-17 NOTE — Patient Instructions (Signed)
It was great to see you.  Take the diflucan tablet -- we will go ahead and treat for a yeast infection.  If itching persists, may use the nystatin cream on the outside of your area.  We will contact you with lab results.  If symptoms persist, we may need you to go to see your ob-gyn.

## 2017-08-17 NOTE — Patient Instructions (Addendum)
Also check out [employer]" which is a program developed by Dr. Myles Lipps.   There are links to a couple of his YouTube Videos below and I would like to see performing one of his videos 5-6 days per week.    A good intro video is: "Independence from Pain 7-minute Video" - [email or web address]   His more advanced video is: "Powerful Posture and Pain Relief: 12 minutes of Foundation Training" - https://[email or web address].[email or web address]  Do not try to attempt this entire video when first beginning.    Try breaking of each exercise that he goes into shorter segments.  Otherwise if they perform an exercise for 45 seconds, start with 15 seconds and rest and then resume when they begin the new activity.    If you work your way up to doing this 12 minute video, I expect you will see significant improvements in your pain.  If you enjoy his videos and would like to find out more you can look on his website: [email or web address].  He has a workout streaming option as well as a DVD set available for purchase.  Amazon has the best price for his DVDs.    Please perform the exercise program that we have prepared for you and gone over in detail on a daily basis.  In addition to the handout you were provided you can access your program through: [email or web address]   Your unique program code is:  [id number]

## 2017-08-18 LAB — CERVICOVAGINAL ANCILLARY ONLY
Bacterial vaginitis: NEGATIVE
CHLAMYDIA, DNA PROBE: NEGATIVE
Candida vaginitis: NEGATIVE
Neisseria Gonorrhea: NEGATIVE
Trichomonas: NEGATIVE

## 2017-08-19 ENCOUNTER — Ambulatory Visit: Payer: 59

## 2017-08-24 ENCOUNTER — Other Ambulatory Visit: Payer: Self-pay | Admitting: Physician Assistant

## 2017-08-24 ENCOUNTER — Encounter: Payer: Self-pay | Admitting: Physician Assistant

## 2017-08-24 ENCOUNTER — Encounter: Payer: Self-pay | Admitting: Sports Medicine

## 2017-08-24 MED ORDER — METFORMIN HCL 500 MG PO TABS: 500.0000 mg | ORAL_TABLET | Freq: Every day | ORAL | 0 refills | Status: DC

## 2017-08-25 ENCOUNTER — Ambulatory Visit (INDEPENDENT_AMBULATORY_CARE_PROVIDER_SITE_OTHER): Payer: 59

## 2017-08-25 DIAGNOSIS — E538 Deficiency of other specified B group vitamins: Secondary | ICD-10-CM

## 2017-08-25 MED ORDER — CYANOCOBALAMIN 1000 MCG/ML IJ SOLN
1000.0000 ug | Freq: Once | INTRAMUSCULAR | Status: AC
  Administered 2017-08-25: 1000 ug via INTRAMUSCULAR

## 2017-08-25 NOTE — Progress Notes (Signed)
Patient here today for nurse visit for B12 IM injection 1000 mcg. Patient tolerated well. Patient will return next week for 4th B12 injection.

## 2017-08-26 ENCOUNTER — Encounter: Payer: Self-pay | Admitting: Advanced Practice Midwife

## 2017-08-26 ENCOUNTER — Ambulatory Visit (INDEPENDENT_AMBULATORY_CARE_PROVIDER_SITE_OTHER): Payer: 59 | Admitting: Advanced Practice Midwife

## 2017-08-26 ENCOUNTER — Other Ambulatory Visit (HOSPITAL_COMMUNITY)
Admission: RE | Admit: 2017-08-26 | Discharge: 2017-08-26 | Disposition: A | Discharge status: Home / Self Care | Payer: 59 | Source: Ambulatory Visit | Attending: Advanced Practice Midwife | Admitting: Advanced Practice Midwife

## 2017-08-26 VITALS — BP 120/80 | HR 98 | Ht 67.0 in | Wt 271.0 lb

## 2017-08-26 DIAGNOSIS — Z01419 Encounter for gynecological examination (general) (routine) without abnormal findings: Secondary | ICD-10-CM

## 2017-08-26 NOTE — Progress Notes (Signed)
Crystal Salinas 37 y.o.  Vitals:   08/26/17 0940  BP: 120/80  Pulse: 98     Filed Weights   08/26/17 0940  Weight: 271 lb (122.9 kg)    Past Medical History: Past Medical History:  Diagnosis Date  . Anxiety   . Breast mass    left breast mass    Past Surgical History: Past Surgical History:  Procedure Laterality Date  . tubes in ears Left 1990    Family History: Family History  Problem Relation Age of Onset  . Multiple sclerosis Father   . Diabetes Maternal Grandmother   . Emphysema Maternal Grandmother   . Stroke Paternal Grandmother   . Hypertension Paternal Grandmother     Social History: Social History   Tobacco Use  . Smoking status: Never Smoker  . Smokeless tobacco: Never Used  Substance Use Topics  . Alcohol use: No  . Drug use: No    Allergies:  Allergies  Allergen Reactions  . Amoxicillin Rash      Current Outpatient Medications:  .  escitalopram (LEXAPRO) 10 MG tablet, Take 10 mg by mouth at bedtime., Disp: , Rfl:  .  Magnesium 250 MG TABS, Take 1 tablet every other day by mouth., Disp: , Rfl:  .  metFORMIN (GLUCOPHAGE) 500 MG tablet, Take 1 tablet (500 mg total) by mouth daily with breakfast., Disp: 90 tablet, Rfl: 0 .  norgestimate-ethinyl estradiol (ORTHO-CYCLEN,SPRINTEC,PREVIFEM) 0.25-35 MG-MCG tablet, Take 1 tablet by mouth daily. (Patient taking differently: Take 1 tablet by mouth at bedtime. ), Disp: 1 Package, Rfl: 11 .  nystatin cream (MYCOSTATIN), Apply 1 application topically 2 (two) times daily., Disp: 30 g, Rfl: 0 .  Omega-3 Fatty Acids (OMEGA-3 FISH OIL PO), Take 1 capsule daily by mouth., Disp: , Rfl:  .  omeprazole (PRILOSEC) 20 MG capsule, Take 20 mg by mouth at bedtime., Disp: , Rfl:  .  POTASSIUM CHLORIDE PO, Take 1 tablet every other day by mouth., Disp: , Rfl:  .  ranitidine (ZANTAC) 150 MG tablet, Take 1 tablet (150 mg total) by mouth 2 (two) times daily., Disp: 60 tablet, Rfl: 1 .  LORazepam (ATIVAN) 0.5 MG tablet,  Take 1 tablet (0.5 mg total) at bedtime by mouth. (Patient not taking: Reported on 08/26/2017), Disp: 15 tablet, Rfl: 0 .  ondansetron (ZOFRAN) 4 MG tablet, TAKE 1 TABLET EVERY 8 HOURS AS NEEDED FOR NAUSEA & VOMITING (Patient not taking: Reported on 08/26/2017), Disp: 20 tablet, Rfl: 0  History of Present Illness: Here for pap. Last pap 2015, normal. On Sprintec for [facility] since getting IUD removed last month, doing fine.  Saw Dr .Berline Chough a few weeks ago and got adjusted, says it helped w/hips/abd pain. Has exercises that she is doing. Needs mammogram/US next months as a 6 month f/u for probably benign lymph node in left breast. Says they didn't image right breast, nothing about right breast in any of the reports. Should go ahead and get bilateral mammogram this time.  Had STD testing last week, all neg  Review of Systems   Patient denies any headaches, blurred vision, shortness of breath, chest pain, abdominal pain, problems with bowel movements, urination, or intercourse.   Physical Exam: General:  Well developed, well nourished, no acute distress Skin:  Warm and dry Neck:  Midline trachea, normal thyroid Lungs; Clear to auscultation bilaterally Breast:  No dominant palpable mass, retraction, or nipple discharge Cardiovascular: Regular rate and rhythm Abdomen:  Soft, non tender, no hepatosplenomegaly Pelvic:  External  genitalia is normal in appearance.  The vagina is normal in appearance.  The cervix is bulbous.  Uterus is felt to be normal size, shape, and contour.  No adnexal masses or tenderness noted. Exam limited by habitus Extremities:  No swelling or varicosities noted Psych:  No mood changes.     Impression: normal gyn exam     Plan: if pap normal, repeat q 3 eyars

## 2017-08-31 ENCOUNTER — Ambulatory Visit: Payer: 59

## 2017-08-31 LAB — CYTOLOGY - PAP
ADEQUACY: ABSENT
DIAGNOSIS: NEGATIVE
HPV: NOT DETECTED

## 2017-09-01 ENCOUNTER — Encounter: Payer: Self-pay | Admitting: *Deleted

## 2017-09-01 ENCOUNTER — Ambulatory Visit (INDEPENDENT_AMBULATORY_CARE_PROVIDER_SITE_OTHER): Payer: 59 | Admitting: *Deleted

## 2017-09-01 DIAGNOSIS — E538 Deficiency of other specified B group vitamins: Secondary | ICD-10-CM

## 2017-09-01 MED ORDER — CYANOCOBALAMIN 1000 MCG/ML IJ SOLN
1000.0000 ug | Freq: Once | INTRAMUSCULAR | Status: AC
  Administered 2017-09-01: 1000 ug via INTRAMUSCULAR

## 2017-09-01 NOTE — Progress Notes (Addendum)
Per orders of Jarold Motto, PA-C injection of Vitamin B12 1000 mcg given in Right Deltoid by [name], LPN Patient tolerated injection well. Pt told to return in one month for next injection. Pt verbalized understanding.   I agree with the above.  Jarold Motto, PA-C

## 2017-09-07 NOTE — Telephone Encounter (Signed)
Pt does not have insurance  Crystal Salinas, Crystal Salinas  Crystal Salinas, CMA  Cc: Janeece AgeeHiggins, Crystal C        Pt does not have insurance at this time. Will check coverage in January.  Thanks,  Crystal

## 2017-09-07 NOTE — Telephone Encounter (Signed)
[facility] now has [facility] and is ready to move forward with her sleep study. I will send to pre cert.

## 2017-09-10 ENCOUNTER — Ambulatory Visit: Payer: 59 | Admitting: Physician Assistant

## 2017-09-10 ENCOUNTER — Encounter: Payer: Self-pay | Admitting: Physician Assistant

## 2017-09-10 VITALS — BP 110/70 | HR 86 | Temp 98.5°F | Ht 67.0 in | Wt 276.4 lb

## 2017-09-10 DIAGNOSIS — F419 Anxiety disorder, unspecified: Secondary | ICD-10-CM | POA: Diagnosis not present

## 2017-09-10 NOTE — Patient Instructions (Signed)
Please change appointment for physical.

## 2017-09-10 NOTE — Progress Notes (Signed)
Crystal Salinas is a 37 y.o. female is here to follow up on Anxiety.  I acted as a Neurosurgeon for [employer], PA-C [name], LPN  History of Present Illness:   Chief Complaint  Patient presents with  . Anxiety    Anxiety  Presents for follow-up visit. Symptoms include malaise (just from working so many days in a row) and nausea. Patient reports no chest pain, decreased concentration, depressed mood, dizziness, dry mouth, excessive worry, insomnia, irritability, muscle tension, nervous/anxious behavior, palpitations, panic, restlessness, shortness of breath or suicidal ideas. Symptoms occur occasionally. The most recent episode lasted 7 days. The severity of symptoms is mild. Hours of sleep per night: 5-7 due to work. The quality of sleep is fair. Nighttime awakenings: none.   Compliance with medications is 0-25%.   She is not taking medication. She will occasionally take CBD oil. She is doing much better, planning to take a summer class ([employer] 2), hoping to establish a community garden in her community.  GAD 7 : Generalized Anxiety Score 09/10/2017 08/10/2017 02/19/2017 02/12/2017  Nervous, Anxious, on Edge 0 2 3 1   Control/stop worrying 0 3 3 3   Worry too much - different things 0 3 3 3   Trouble relaxing 0 2 3 1   Restless 0 1 0 0  Easily annoyed or irritable 0 1 0 0  Afraid - awful might happen 0 3 3 3   Total GAD 7 Score 0 15 15 11   Anxiety Difficulty Not difficult at all Somewhat difficult Somewhat difficult -      There are no preventive care reminders to display for this patient.  Past Medical History:  Diagnosis Date  . Anxiety   . Breast mass    left breast mass     Social History   Socioeconomic History  . Marital status: Single    Spouse name: Not on file  . Number of children: Not on file  . Years of education: Not on file  . Highest education level: Not on file  Occupational History  . Not on file  Social Needs  . Financial resource strain: Not on  file  . Food insecurity:    Worry: Not on file    Inability: Not on file  . Transportation needs:    Medical: Not on file    Non-medical: Not on file  Tobacco Use  . Smoking status: Never Smoker  . Smokeless tobacco: Never Used  Substance and Sexual Activity  . Alcohol use: No  . Drug use: No  . Sexual activity: Yes    Birth control/protection: Pill    Comment: Mirena  Lifestyle  . Physical activity:    Days per week: Not on file    Minutes per session: Not on file  . Stress: Not on file  Relationships  . Social connections:    Talks on phone: Not on file    Gets together: Not on file    Attends religious service: Not on file    Active member of club or organization: Not on file    Attends meetings of clubs or organizations: Not on file    Relationship status: Not on file  . Intimate partner violence:    Fear of current or ex partner: Not on file    Emotionally abused: Not on file    Physically abused: Not on file    Forced sexual activity: Not on file  Other Topics Concern  . 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 tubes in ears Left 1990    Family History  Problem Relation Age of Onset  . Multiple sclerosis Father   . Diabetes Maternal Grandmother   . Emphysema Maternal Grandmother   . Stroke Paternal Grandmother   . Hypertension Paternal Grandmother     PMHx, SurgHx, SocialHx, FamHx, Medications, and Allergies were reviewed in the Visit Navigator and updated as appropriate.   Patient Active Problem List   Diagnosis Date Noted  . Chest tightness 03/01/2017  . Palpitations 03/01/2017  . POLYCYSTIC OVARIAN  DISEASE 08/19/2009  . OBESITY 08/19/2009    Social History   Tobacco Use  . Smoking status: Never Smoker  . Smokeless tobacco: Never Used  Substance Use Topics  . Alcohol use: No  . Drug use: No    Current Medications and Allergies:    Current Outpatient Medications:  .  escitalopram (LEXAPRO) 10 MG tablet, Take 10 mg by mouth at bedtime., Disp: , Rfl:  .  LORazepam (ATIVAN) 0.5 MG tablet, Take 1 tablet (0.5 mg total) at bedtime by mouth., Disp: 15 tablet, Rfl: 0 .  Magnesium 250 MG TABS, Take 1 tablet every other day by mouth., Disp: , Rfl:  .  metFORMIN (GLUCOPHAGE) 500 MG tablet, Take 1 tablet (500 mg total) by mouth daily with breakfast., Disp: 90 tablet, Rfl: 0 .  norgestimate-ethinyl estradiol (ORTHO-CYCLEN,SPRINTEC,PREVIFEM) 0.25-35 MG-MCG tablet, Take 1 tablet by mouth daily. (Patient taking differently: Take 1 tablet by mouth at bedtime. ), Disp: 1 Package, Rfl: 11 .  Omega-3 Fatty Acids (OMEGA-3 FISH OIL PO), Take 1 capsule daily by mouth., Disp: , Rfl:  .  ondansetron (ZOFRAN) 4 MG tablet, TAKE 1 TABLET EVERY 8 HOURS AS NEEDED FOR NAUSEA & VOMITING, Disp: 20 tablet, Rfl: 0 .  POTASSIUM CHLORIDE PO, Take 1 tablet every other day by mouth., Disp: , Rfl:  .  ranitidine (ZANTAC) 150 MG tablet, Take 1 tablet (150 mg total) by mouth 2 (two) times daily., Disp: 60 tablet, Rfl: 1 .  nystatin cream (MYCOSTATIN), Apply 1 application topically 2 (two) times daily. (Patient not taking: Reported on 09/10/2017), Disp: 30 g, Rfl: 0   Allergies  Allergen Reactions  . Amoxicillin Rash    Review of Systems   Review of Systems  Constitutional: Negative for irritability.  Respiratory: Negative for shortness of breath.   Cardiovascular: Negative for chest pain and palpitations.  Gastrointestinal: Positive for nausea.  Neurological: Negative for dizziness.  Psychiatric/Behavioral: Negative for decreased concentration and suicidal ideas. The patient is not nervous/anxious and does not  have insomnia.     Vitals:   Vitals:   09/10/17 0937  BP: 110/70  Pulse: 86  Temp: 98.5 F (36.9 C)  TempSrc: Oral  SpO2: 96%  Weight: 276 lb 6.1 oz (125.4 kg)  Height: 5\' 7"  (1.702 m)     Body mass index is 43.29 kg/m.   Physical Exam:    Physical Exam  Constitutional: She appears well-developed. She is cooperative.  Non-toxic appearance. She does not have a sickly appearance. She does not appear ill. No distress.  Cardiovascular:  Normal rate, regular rhythm, S1 normal, S2 normal, normal heart sounds and normal pulses.  No LE edema  Pulmonary/Chest: Effort normal and breath sounds normal.  Neurological: She is alert. GCS eye subscore is 4. GCS verbal subscore is 5. GCS motor subscore is 6.  Skin: Skin is warm, dry and intact.  Psychiatric: She has a normal mood and affect. Her speech is normal and behavior is normal.  Nursing note and vitals reviewed.    Assessment and Plan:    Emya was seen today for anxiety.  Diagnoses and all orders for this visit:  Anxiety   Well-controlled. Continue current supportive measures. Follow-up at end of month for a physical, sooner if needed.   . Reviewed expectations re: course of current medical issues. . Discussed self-management of symptoms. . Outlined signs and symptoms indicating need for more acute intervention. . Patient verbalized understanding and all questions were answered. . See orders for this visit as documented in the electronic medical record. . Patient received an After Visit Summary.  CMA or LPN served as scribe during this visit. History, Physical, and Plan performed by medical provider. Documentation and orders reviewed and attested to.   Jarold Motto, PA-C [place] 09/10/2017  Follow-up: No follow-ups on file.

## 2017-09-15 ENCOUNTER — Telehealth: Payer: Self-pay | Admitting: *Deleted

## 2017-09-15 NOTE — Telephone Encounter (Signed)
Sent staff message to [place] [facility] no [facility] required. Also told informed patient will need appointment to re evaluate her symptoms. Dr Michaelle CopasSmith's last OV greater than 6 months old. If patient does have OSA when it's time to get CPAP equipment they the insurance will require evaluation and documentation to be less than 6 months old. She can be seen by a mid level. Does not need to be a MD.

## 2017-09-17 ENCOUNTER — Telehealth: Payer: Self-pay | Admitting: *Deleted

## 2017-09-17 NOTE — Telephone Encounter (Signed)
-----   Message from Gaynelle CageWanda M Waddell, [place] sent at 09/15/2017  4:07 PM EDT ----- Regarding: RE: precert Per [facility] no PA required. However patient will need appointment with someone not necessarily a MD to re evaluate symptoms. Dr Michaelle CopasSmith's OV over 6 months old. If she does have sleep apnea when it's time to get her equipment they will need a office note less than 6 months old before insurance will cover it. ----- Message ----- From: Reesa ChewJones, Becket Wecker G, CMA Sent: 09/07/2017   3:15 PM To: Cv Div Sleep Studies Subject: precert                                        [facility] and is ready to move forward with her sleep study.

## 2017-09-17 NOTE — Telephone Encounter (Signed)
Patient is scheduled for lab study on Tues 10/12/17. Patient [facility]. Patient understands she will receive a sleep packet in a week or so. Patient understands to call if she does not receive the sleep packet in a timely manner.  Left detailed message on voicemail with date and time  and informed patient to call back to confirm or reschedule.

## 2017-09-20 ENCOUNTER — Other Ambulatory Visit (HOSPITAL_COMMUNITY): Payer: Self-pay | Admitting: *Deleted

## 2017-09-20 DIAGNOSIS — Z09 Encounter for follow-up examination after completed treatment for conditions other than malignant neoplasm: Secondary | ICD-10-CM

## 2017-09-20 DIAGNOSIS — N6320 Unspecified lump in the left breast, unspecified quadrant: Secondary | ICD-10-CM

## 2017-09-28 ENCOUNTER — Encounter (HOSPITAL_COMMUNITY): Payer: 59

## 2017-09-29 ENCOUNTER — Encounter: Payer: Self-pay | Admitting: Physician Assistant

## 2017-09-29 ENCOUNTER — Ambulatory Visit (INDEPENDENT_AMBULATORY_CARE_PROVIDER_SITE_OTHER): Payer: 59 | Admitting: Physician Assistant

## 2017-09-29 ENCOUNTER — Ambulatory Visit: Payer: 59

## 2017-09-29 VITALS — BP 120/84 | HR 79 | Temp 98.5°F | Ht 67.0 in | Wt 278.0 lb

## 2017-09-29 DIAGNOSIS — Z0000 Encounter for general adult medical examination without abnormal findings: Secondary | ICD-10-CM

## 2017-09-29 DIAGNOSIS — Z136 Encounter for screening for cardiovascular disorders: Secondary | ICD-10-CM

## 2017-09-29 DIAGNOSIS — E282 Polycystic ovarian syndrome: Secondary | ICD-10-CM | POA: Diagnosis not present

## 2017-09-29 DIAGNOSIS — E66813 Obesity, class 3: Secondary | ICD-10-CM

## 2017-09-29 DIAGNOSIS — R7309 Other abnormal glucose: Secondary | ICD-10-CM

## 2017-09-29 DIAGNOSIS — E538 Deficiency of other specified B group vitamins: Secondary | ICD-10-CM | POA: Diagnosis not present

## 2017-09-29 DIAGNOSIS — Z13220 Encounter for screening for lipoid disorders: Secondary | ICD-10-CM

## 2017-09-29 DIAGNOSIS — E559 Vitamin D deficiency, unspecified: Secondary | ICD-10-CM | POA: Diagnosis not present

## 2017-09-29 DIAGNOSIS — F419 Anxiety disorder, unspecified: Secondary | ICD-10-CM

## 2017-09-29 LAB — LIPID PANEL
Cholesterol: 182 mg/dL (ref 0–200)
HDL: 47.5 mg/dL (ref >39.00)
LDL CALC: 122 mg/dL — AB (ref 0–99)
NonHDL: 134.56
TRIGLYCERIDES: 64 mg/dL (ref 0.0–149.0)
Total CHOL/HDL Ratio: 4
VLDL: 12.8 mg/dL (ref 0.0–40.0)

## 2017-09-29 LAB — HEMOGLOBIN A1C: HEMOGLOBIN A1C: 6.2 % (ref 4.6–6.5)

## 2017-09-29 MED ORDER — CYANOCOBALAMIN 1000 MCG/ML IJ SOLN
1000.0000 ug | Freq: Once | INTRAMUSCULAR | Status: AC
  Administered 2017-09-29: 1000 ug via INTRAMUSCULAR

## 2017-09-29 NOTE — Progress Notes (Signed)
I acted as a Neurosurgeonscribe for [employer]antha Kelli Robeck, PA-C Corky Mullonna Orphanos, LPN  Subjective:    Crystal Salinas is a 37 y.o. female and is here for a comprehensive physical exam.  HPI  There are no preventive care reminders to display for this patient.  Acute Concerns: None  Chronic Issues: B12 deficiency --currently undergoing B12 injections in our office.  She is tolerating this well. Elevated glucose --we are going to recheck her hemoglobin A1c today.  She is currently taking metformin 500 mg daily.  She does have a history of PCOS, currently managed by her OB/GYN. Lab Results  Component Value Date   HGBA1C 5.9 02/12/2017  Obesity --she has recently started a plant-based diet.  She exercises regularly. Vit D deficiency-- most recent lab checked 08/11/17 at 24.51.  Anxiety --currently well controlled.  She is no longer taking Lexapro.  Health Maintenance: Immunizations -- UTD Colonoscopy -- N/A Mammogram -- follow-up mammogram on 10/05/17 for breast mass PAP -- done, 08/26/2017 NILM / HPV Negative Diet -- plant-based diet Caffeine intake -- minimal Sleep habits -- undergoing sleep study on 10/05/17 Exercise -- becoming more regularly active Current Weight -- Weight: 278 lb (126.1 kg)  Weight History: Wt Readings from Last 10 Encounters:  09/29/17 278 lb (126.1 kg)  09/10/17 276 lb 6.1 oz (125.4 kg)  08/26/17 271 lb (122.9 kg)  08/17/17 273 lb (123.8 kg)  08/17/17 273 lb (123.8 kg)  08/10/17 269 lb 8 oz (122.2 kg)  08/09/17 271 lb (122.9 kg)  08/06/17 275 lb 4 oz (124.9 kg)  07/29/17 275 lb (124.7 kg)  07/27/17 275 lb 12.8 oz (125.1 kg)   Mood -- good Patient's last menstrual period was 09/22/2017.  Depression screen PHQ 2/9 09/29/2017  Decreased Interest 0  Down, Depressed, Hopeless 0  PHQ - 2 Score 0   Other providers/specialists: [facility] -- Ob-Gyn Dr. Verdis PrimeHenry Smith -- cardiology Dr. Ileene PatrickSteven Armbruster -- gastro  PMHx, SurgHx, SocialHx, Medications, and Allergies  were reviewed in the Visit Navigator and updated as appropriate.   Past Medical History:  Diagnosis Date  . Anxiety   . Breast mass    left breast mass     Past Surgical History:  Procedure Laterality Date  . tubes in ears Left 1990     Family History  Problem Relation Age of Onset  . Multiple sclerosis Father   . Diabetes Maternal Grandmother   . Emphysema Maternal Grandmother   . Stroke Paternal Grandmother   . Hypertension Paternal Grandmother     Social History   Tobacco Use  . Smoking status: Never Smoker  . Smokeless tobacco: Never Used  Substance Use Topics  . Alcohol use: No  . Drug use: No    Review of Systems:   Review of Systems  Constitutional: Negative.  Negative for chills, fever, malaise/fatigue and weight loss.  HENT: Negative.  Negative for hearing loss, sinus pain and sore throat.   Eyes: Negative.  Negative for blurred vision.  Respiratory: Negative.  Negative for cough and shortness of breath.   Cardiovascular: Negative.  Negative for chest pain, palpitations and leg swelling.  Gastrointestinal: Negative.  Negative for abdominal pain, constipation, diarrhea, heartburn, nausea and vomiting.  Genitourinary: Negative.  Negative for dysuria, frequency and urgency.  Musculoskeletal: Negative.  Negative for back pain, myalgias and neck pain.  Skin: Negative.  Negative for itching and rash.  Neurological: Negative.  Negative for dizziness, tingling, seizures, loss of consciousness and headaches.  Endo/Heme/Allergies: Negative.  Negative for  polydipsia.  Psychiatric/Behavioral: Negative.  Negative for depression. The patient is not nervous/anxious.     Objective:   BP 120/84 (BP Location: Left Arm, Patient Position: Sitting, Cuff Size: Large)   Pulse 79   Temp 98.5 F (36.9 C) (Oral)   Ht 5\' 7"  (1.702 m)   Wt 278 lb (126.1 kg)   LMP 09/22/2017   SpO2 96%   BMI 43.54 kg/m   General Appearance:    Alert, cooperative, no distress, appears  stated age  Head:    Normocephalic, without obvious abnormality, atraumatic  Eyes:    PERRL, conjunctiva/corneas clear, EOM's intact, fundi    benign, both eyes  Ears:    Normal TM's and external ear canals, both ears  Nose:   Nares normal, septum midline, mucosa normal, no drainage    or sinus tenderness  Throat:   Lips, mucosa, and tongue normal; teeth and gums normal  Neck:   Supple, symmetrical, trachea midline, no adenopathy;    thyroid:  no enlargement/tenderness/nodules; no carotid   bruit or JVD  Back:     Symmetric, no curvature, ROM normal, no CVA tenderness  Lungs:     Clear to auscultation bilaterally, respirations unlabored  Chest Wall:    No tenderness or deformity   Heart:    Regular rate and rhythm, S1 and S2 normal, no murmur, rub   or gallop  Breast Exam:    No tenderness, masses, or nipple abnormality  Abdomen:     Soft, non-tender, bowel sounds active all four quadrants,    no masses, no organomegaly  Genitalia:    Normal female without lesion, discharge or tenderness  Rectal:    Normal tone no masses or tenderness  Extremities:   Extremities normal, atraumatic, no cyanosis or edema  Pulses:   2+ and symmetric all extremities  Skin:   Skin color, texture, turgor normal, no rashes or lesions  Lymph nodes:   Cervical, supraclavicular, and axillary nodes normal  Neurologic:   CNII-XII intact, normal strength, sensation and reflexes    throughout    Assessment/Plan:   Crystal Salinas was seen today for annual exam.  Diagnoses and all orders for this visit:  Routine physical examination Today patient counseled on age appropriate routine health concerns for screening and prevention, each reviewed and up to date or declined. Immunizations reviewed and up to date or declined. Labs ordered and reviewed. Risk factors for depression reviewed and negative. Hearing function and visual acuity are intact. ADLs screened and addressed as needed. Functional ability and level of safety  reviewed and appropriate. Education, counseling and referrals performed based on assessed risks today. Patient provided with a copy of personalized plan for preventive services.  B12 deficiency Continue Vit B12 supplements per protocol. -     cyanocobalamin ((VITAMIN B-12)) injection 1,000 mcg  Elevated glucose Recheck a HgbA1c today. Continue Metformin.  -     Hemoglobin A1c  PCOS Continue Metformin. Continue OCP. Consider spironolactone if needed in future.  Encounter for lipid screening for cardiovascular disease -     Lipid panel  Class 3 severe obesity in adult, unspecified BMI, unspecified obesity type, unspecified whether serious comorbidity present [facility]) She has started a plant based diet. Continues to be active at gym and with her kids. Commended her on her success.  Anxiety Overall doing well, not on Lexapro.   Well Adult Exam: Labs ordered: Yes. Patient counseling was done. See below for items discussed. Discussed the patient's BMI. The BMI is in the  acceptable range Follow up in 6 months.  Patient Counseling:   [x]     Nutrition: Stressed importance of moderation in sodium/caffeine intake, saturated fat and cholesterol, caloric balance, sufficient intake of fresh fruits, vegetables, fiber, calcium, iron, and 1 mg of folate supplement per day (for females capable of pregnancy).   [x]      Stressed the importance of regular exercise.    [x]     Substance Abuse: Discussed cessation/primary prevention of tobacco, alcohol, or other drug use; driving or other dangerous activities under the influence; availability of treatment for abuse.    [x]      Injury prevention: Discussed safety belts, safety helmets, smoke detector, smoking near bedding or upholstery.    [x]      Sexuality: Discussed sexually transmitted diseases, partner selection, use of condoms, avoidance of unintended pregnancy  and contraceptive alternatives.    [x]     Dental health: Discussed importance of  regular tooth brushing, flossing, and dental visits.   [x]      Health maintenance and immunizations reviewed. Please refer to Health maintenance section.   CMA or LPN served as scribe during this visit. History, Physical, and Plan performed by medical provider. Documentation and orders reviewed and attested to.  Jarold Motto, PA-C [facility]

## 2017-09-29 NOTE — Patient Instructions (Signed)
It was great to see you!  We will be in touch soon regarding your lab results.  Good luck on the diet changes, you go this!  Health Maintenance, Female Adopting a healthy lifestyle and getting preventive care can go a long way to promote health and wellness. Talk with your health care provider about what schedule of regular examinations is right for you. This is a good chance for you to check in with your provider about disease prevention and staying healthy. In between checkups, there are plenty of things you can do on your own. Experts have done a lot of research about which lifestyle changes and preventive measures are most likely to keep you healthy. Ask your health care provider for more information. Weight and diet Eat a healthy diet  Be sure to include plenty of vegetables, fruits, low-fat dairy products, and lean protein.  Do not eat a lot of foods high in solid fats, added sugars, or salt.  Get regular exercise. This is one of the most important things you can do for your health. ? Most adults should exercise for at least 150 minutes each week. The exercise should increase your heart rate and make you sweat (moderate-intensity exercise). ? Most adults should also do strengthening exercises at least twice a week. This is in addition to the moderate-intensity exercise.  Maintain a healthy weight  Body mass index (BMI) is a measurement that can be used to identify possible weight problems. It estimates body fat based on height and weight. Your health care provider can help determine your BMI and help you achieve or maintain a healthy weight.  For females 54 years of age and older: ? A BMI below 18.5 is considered underweight. ? A BMI of 18.5 to 24.9 is normal. ? A BMI of 25 to 29.9 is considered overweight. ? A BMI of 30 and above is considered obese.  Watch levels of cholesterol and blood lipids  You should start having your blood tested for lipids and cholesterol at 39 years  of age, then have this test every 5 years.  You may need to have your cholesterol levels checked more often if: ? Your lipid or cholesterol levels are high. ? You are older than 39 years of age. ? You are at high risk for heart disease.  Cancer screening Lung Cancer  Lung cancer screening is recommended for adults 69-22 years old who are at high risk for lung cancer because of a history of smoking.  A yearly low-dose CT scan of the lungs is recommended for people who: ? Currently smoke. ? Have quit within the past 15 years. ? Have at least a 30-pack-year history of smoking. A pack year is smoking an average of one pack of cigarettes a day for 1 year.  Yearly screening should continue until it has been 15 years since you quit.  Yearly screening should stop if you develop a health problem that would prevent you from having lung cancer treatment.  Breast Cancer  Practice breast self-awareness. This means understanding how your breasts normally appear and feel.  It also means doing regular breast self-exams. Let your health care provider know about any changes, no matter how small.  If you are in your 20s or 30s, you should have a clinical breast exam (CBE) by a health care provider every 1-3 years as part of a regular health exam.  If you are 56 or older, have a CBE every year. Also consider having a breast X-ray (mammogram)  every year.  If you have a family history of breast cancer, talk to your health care provider about genetic screening.  If you are at high risk for breast cancer, talk to your health care provider about having an MRI and a mammogram every year.  Breast cancer gene (BRCA) assessment is recommended for women who have family members with BRCA-related cancers. BRCA-related cancers include: ? Breast. ? Ovarian. ? Tubal. ? Peritoneal cancers.  Results of the assessment will determine the need for genetic counseling and BRCA1 and BRCA2 testing.  Cervical  Cancer Your health care provider may recommend that you be screened regularly for cancer of the pelvic organs (ovaries, uterus, and vagina). This screening involves a pelvic examination, including checking for microscopic changes to the surface of your cervix (Pap test). You may be encouraged to have this screening done every 3 years, beginning at age 45.  For women ages 53-65, health care providers may recommend pelvic exams and Pap testing every 3 years, or they may recommend the Pap and pelvic exam, combined with testing for human papilloma virus (HPV), every 5 years. Some types of HPV increase your risk of cervical cancer. Testing for HPV may also be done on women of any age with unclear Pap test results.  Other health care providers may not recommend any screening for nonpregnant women who are considered low risk for pelvic cancer and who do not have symptoms. Ask your health care provider if a screening pelvic exam is right for you.  If you have had past treatment for cervical cancer or a condition that could lead to cancer, you need Pap tests and screening for cancer for at least 20 years after your treatment. If Pap tests have been discontinued, your risk factors (such as having a new sexual partner) need to be reassessed to determine if screening should resume. Some women have medical problems that increase the chance of getting cervical cancer. In these cases, your health care provider may recommend more frequent screening and Pap tests.  Colorectal Cancer  This type of cancer can be detected and often prevented.  Routine colorectal cancer screening usually begins at 39 years of age and continues through 39 years of age.  Your health care provider may recommend screening at an earlier age if you have risk factors for colon cancer.  Your health care provider may also recommend using home test kits to check for hidden blood in the stool.  A small camera at the end of a tube can be used to  examine your colon directly (sigmoidoscopy or colonoscopy). This is done to check for the earliest forms of colorectal cancer.  Routine screening usually begins at age 74.  Direct examination of the colon should be repeated every 5-10 years through 39 years of age. However, you may need to be screened more often if early forms of precancerous polyps or small growths are found.  Skin Cancer  Check your skin from head to toe regularly.  Tell your health care provider about any new moles or changes in moles, especially if there is a change in a mole's shape or color.  Also tell your health care provider if you have a mole that is larger than the size of a pencil eraser.  Always use sunscreen. Apply sunscreen liberally and repeatedly throughout the day.  Protect yourself by wearing long sleeves, pants, a wide-brimmed hat, and sunglasses whenever you are outside.  Heart disease, diabetes, and high blood pressure  High blood pressure causes  heart disease and increases the risk of stroke. High blood pressure is more likely to develop in: ? People who have blood pressure in the high end of the normal range (130-139/85-89 mm Hg). ? People who are overweight or obese. ? People who are African American.  If you are 51-35 years of age, have your blood pressure checked every 3-5 years. If you are 45 years of age or older, have your blood pressure checked every year. You should have your blood pressure measured twice-once when you are at a hospital or clinic, and once when you are not at a hospital or clinic. Record the average of the two measurements. To check your blood pressure when you are not at a hospital or clinic, you can use: ? An automated blood pressure machine at a pharmacy. ? A home blood pressure monitor.  If you are between 108 years and 69 years old, ask your health care provider if you should take aspirin to prevent strokes.  Have regular diabetes screenings. This involves taking a  blood sample to check your fasting blood sugar level. ? If you are at a normal weight and have a low risk for diabetes, have this test once every three years after 39 years of age. ? If you are overweight and have a high risk for diabetes, consider being tested at a younger age or more often. Preventing infection Hepatitis B  If you have a higher risk for hepatitis B, you should be screened for this virus. You are considered at high risk for hepatitis B if: ? You were born in a country where hepatitis B is common. Ask your health care provider which countries are considered high risk. ? Your parents were born in a high-risk country, and you have not been immunized against hepatitis B (hepatitis B vaccine). ? You have HIV or AIDS. ? You use needles to inject street drugs. ? You live with someone who has hepatitis B. ? You have had sex with someone who has hepatitis B. ? You get hemodialysis treatment. ? You take certain medicines for conditions, including cancer, organ transplantation, and autoimmune conditions.  Hepatitis C  Blood testing is recommended for: ? Everyone born from [birth year] through 1967. ? Anyone with known risk factors for hepatitis C.  Sexually transmitted infections (STIs)  You should be screened for sexually transmitted infections (STIs) including gonorrhea and chlamydia if: ? You are sexually active and are younger than 39 years of age. ? You are older than 39 years of age and your health care provider tells you that you are at risk for this type of infection. ? Your sexual activity has changed since you were last screened and you are at an increased risk for chlamydia or gonorrhea. Ask your health care provider if you are at risk.  If you do not have HIV, but are at risk, it may be recommended that you take a prescription medicine daily to prevent HIV infection. This is called pre-exposure prophylaxis (PrEP). You are considered at risk if: ? You are sexually active and  do not regularly use condoms or know the HIV status of your partner(s). ? You take drugs by injection. ? You are sexually active with a partner who has HIV.  Talk with your health care provider about whether you are at high risk of being infected with HIV. If you choose to begin PrEP, you should first be tested for HIV. You should then be tested every 3 months for as long as  you are taking PrEP. Pregnancy  If you are premenopausal and you may become pregnant, ask your health care provider about preconception counseling.  If you may become pregnant, take 400 to 800 micrograms (mcg) of folic acid every day.  If you want to prevent pregnancy, talk to your health care provider about birth control (contraception). Osteoporosis and menopause  Osteoporosis is a disease in which the bones lose minerals and strength with aging. This can result in serious bone fractures. Your risk for osteoporosis can be identified using a bone density scan.  If you are 77 years of age or older, or if you are at risk for osteoporosis and fractures, ask your health care provider if you should be screened.  Ask your health care provider whether you should take a calcium or vitamin D supplement to lower your risk for osteoporosis.  Menopause may have certain physical symptoms and risks.  Hormone replacement therapy may reduce some of these symptoms and risks. Talk to your health care provider about whether hormone replacement therapy is right for you. Follow these instructions at home:  Schedule regular health, dental, and eye exams.  Stay current with your immunizations.  Do not use any tobacco products including cigarettes, chewing tobacco, or electronic cigarettes.  If you are pregnant, do not drink alcohol.  If you are breastfeeding, limit how much and how often you drink alcohol.  Limit alcohol intake to no more than 1 drink per day for nonpregnant women. One drink equals 12 ounces of beer, 5 ounces of  wine, or 1 ounces of hard liquor.  Do not use street drugs.  Do not share needles.  Ask your health care provider for help if you need support or information about quitting drugs.  Tell your health care provider if you often feel depressed.  Tell your health care provider if you have ever been abused or do not feel safe at home. This information is not intended to replace advice given to you by your health care provider. Make sure you discuss any questions you have with your health care provider. Document Released: 10/06/2010 Document Revised: 08/29/2015 Document Reviewed: 12/25/2014 Elsevier Interactive Patient Education  [employer].

## 2017-10-05 ENCOUNTER — Ambulatory Visit (HOSPITAL_COMMUNITY)
Admission: RE | Admit: 2017-10-05 | Discharge: 2017-10-05 | Disposition: A | Discharge status: Home / Self Care | Payer: 59 | Source: Ambulatory Visit | Attending: *Deleted | Admitting: *Deleted

## 2017-10-05 ENCOUNTER — Encounter (HOSPITAL_BASED_OUTPATIENT_CLINIC_OR_DEPARTMENT_OTHER): Payer: 59

## 2017-10-05 ENCOUNTER — Ambulatory Visit: Payer: 59 | Attending: Interventional Cardiology | Admitting: Neurology

## 2017-10-05 ENCOUNTER — Encounter (HOSPITAL_COMMUNITY): Payer: Self-pay

## 2017-10-05 DIAGNOSIS — N6320 Unspecified lump in the left breast, unspecified quadrant: Secondary | ICD-10-CM | POA: Insufficient documentation

## 2017-10-05 DIAGNOSIS — N6321 Unspecified lump in the left breast, upper outer quadrant: Secondary | ICD-10-CM | POA: Diagnosis not present

## 2017-10-05 DIAGNOSIS — Z09 Encounter for follow-up examination after completed treatment for conditions other than malignant neoplasm: Secondary | ICD-10-CM | POA: Diagnosis not present

## 2017-10-05 DIAGNOSIS — N6323 Unspecified lump in the left breast, lower outer quadrant: Secondary | ICD-10-CM | POA: Diagnosis not present

## 2017-10-05 DIAGNOSIS — I493 Ventricular premature depolarization: Secondary | ICD-10-CM | POA: Insufficient documentation

## 2017-10-05 DIAGNOSIS — R928 Other abnormal and inconclusive findings on diagnostic imaging of breast: Secondary | ICD-10-CM | POA: Diagnosis not present

## 2017-10-05 DIAGNOSIS — R0683 Snoring: Secondary | ICD-10-CM | POA: Insufficient documentation

## 2017-10-07 NOTE — Procedures (Signed)
    Patient Name: Crystal Salinas, Crystal Salinas Study Date:03/23/2017 10/05/2017 Gender: Female D.O.B: [birth year] Age (years): 36 Referring Provider: Verdis PrimeHenry Smith Height (inches): 67 Interpreting Physician: Armanda Magicraci Annalissa Murphey MD, ABSM Weight (lbs): 278 RPSGT: Alfonso EllisHedrick, Debra BMI: 44 MRN: [id number] Neck Size: 17.00  CLINICAL INFORMATION Sleep Study Type: NPSG  Indication for sleep study: N/A  Epworth Sleepiness Score: 8  SLEEP STUDY TECHNIQUE As per the AASM Manual for the Scoring of Sleep and Associated Events v2.3 (April 2016) with a hypopnea requiring 4% desaturations.  The channels recorded and monitored were frontal, central and occipital EEG, electrooculogram (EOG), submentalis EMG (chin), nasal and oral airflow, thoracic and abdominal wall motion, anterior tibialis EMG, snore microphone, electrocardiogram, and pulse oximetry.  MEDICATIONS Medications self-administered by patient taken the night of the study : N/A  SLEEP ARCHITECTURE The study was initiated at 10:47:52 PM and ended at 4:59:21 AM.  Sleep onset time was 18.7 minutes and the sleep efficiency was 93.1%%. The total sleep time was 345.7 minutes.  Stage REM latency was 78.5 minutes.  The patient spent 1.7%% of the night in stage N1 sleep, 61.2%% in stage N2 sleep, 16.6%% in stage N3 and 20.39% in REM.  Alpha intrusion was absent.  Supine sleep was 36.11%.  RESPIRATORY PARAMETERS The overall apnea/hypopnea index (AHI) was 2.8 per hour. There were 2 total apneas, including 1 obstructive, 0 central and 1 mixed apneas. There were 14 hypopneas and 3 RERAs.  The AHI during Stage REM sleep was 11.1 per hour.  AHI while supine was 6.7 per hour.  The mean oxygen saturation was 95.2%. The minimum SpO2 during sleep was 86.0%.  moderate snoring was noted during this study.  CARDIAC DATA The 2 lead EKG demonstrated sinus rhythm. The mean heart rate was N/A beats per minute. Other EKG findings include: PVCs.  LEG MOVEMENT DATA The  total PLMS were 0 with a resulting PLMS index of 0.0. Associated arousal with leg movement index was 4.9 .  IMPRESSIONS - No significant obstructive sleep apnea occurred during this study (AHI = 2.8/h). - No significant central sleep apnea occurred during this study (CAI = 0.0/h). - Mild oxygen desaturation was noted during this study (Min O2 = 86.0%). - The patient snored with moderate snoring volume. - EKG findings include PVCs. - Clinically significant periodic limb movements did not occur during sleep. No significant associated arousals.  DIAGNOSIS - Normal Study  RECOMMENDATIONS - Avoid alcohol, sedatives and other CNS depressants that may worsen sleep apnea and disrupt normal sleep architecture. - Sleep hygiene should be reviewed to assess factors that may improve sleep quality. - Weight management and regular exercise should be initiated or continued if appropriate.  [Electronically signed] 10/07/2017 04:57 PM  Armanda Magicraci Sair Faulcon MD, ABSM Diplomate, American Board of Sleep Medicine

## 2017-10-08 ENCOUNTER — Telehealth: Payer: Self-pay | Admitting: *Deleted

## 2017-10-08 NOTE — Telephone Encounter (Signed)
-----   Message from Quintella Reichertraci R Turner, MD sent at 10/07/2017  5:02 PM EDT ----- Please let patient know that sleep study showed no significant sleep apnea.

## 2017-10-08 NOTE — Telephone Encounter (Signed)
Informed patient of sleep study results and patient understanding was verbalized. Patient understands her sleep study showed no significant sleep apnea.   Pt is aware and agreeable to normal results.  

## 2017-10-12 ENCOUNTER — Encounter (HOSPITAL_BASED_OUTPATIENT_CLINIC_OR_DEPARTMENT_OTHER): Payer: 59

## 2017-11-02 ENCOUNTER — Encounter: Payer: Self-pay | Admitting: Physician Assistant

## 2017-11-05 ENCOUNTER — Ambulatory Visit (INDEPENDENT_AMBULATORY_CARE_PROVIDER_SITE_OTHER): Payer: 59

## 2017-11-05 ENCOUNTER — Telehealth: Payer: Self-pay | Admitting: Radiology

## 2017-11-05 ENCOUNTER — Other Ambulatory Visit: Payer: Self-pay | Admitting: Physician Assistant

## 2017-11-05 DIAGNOSIS — E538 Deficiency of other specified B group vitamins: Secondary | ICD-10-CM | POA: Diagnosis not present

## 2017-11-05 MED ORDER — CYANOCOBALAMIN 1000 MCG/ML IJ SOLN
1000.0000 ug | Freq: Once | INTRAMUSCULAR | Status: AC
  Administered 2017-11-05: 1000 ug via INTRAMUSCULAR

## 2017-11-05 NOTE — Telephone Encounter (Signed)
Pt is on lab schedule but no orders have been placed. Please place orders. Thanks!

## 2017-11-05 NOTE — Progress Notes (Signed)
Patient here for B12 injection. Injection administered in the right deltoid by Marlowe AschoffAria Womble, CMA. No reaction from injection. Patient should return in 1 month for monthly B12 injection.

## 2017-11-05 NOTE — Telephone Encounter (Signed)
Patient needs B12 injection, and I think she was probably mistakenly put on the lab schedule instead of the nurse schedule. Her appointment type has been changed. Will address labs if needed while here at nurse visit.  Jarold MottoSamantha Cylus Douville PA-C

## 2017-11-15 ENCOUNTER — Other Ambulatory Visit: Payer: Self-pay | Admitting: *Deleted

## 2017-11-15 MED ORDER — METFORMIN HCL 500 MG PO TABS: 500.0000 mg | ORAL_TABLET | Freq: Every day | ORAL | 1 refills | Status: DC

## 2017-12-07 ENCOUNTER — Ambulatory Visit: Payer: 59

## 2017-12-12 NOTE — Progress Notes (Signed)
[facility] Neurology [facility] Clinic Note - Initial Visit   Date: 12/13/17  Crystal Salinas MRN: [id number] DOB: 20-Jul-1980   Dear Jarold Motto, PA:  Thank you for your kind referral of Crystal Salinas for consultation of generalized numbness/tingling. Although her history is well known to you, please allow [place] to reiterate it for the purpose of our medical record. The patient was accompanied to the clinic by son who also provides collateral information.     History of Present Illness: Crystal Salinas is a 37 y.o. right-handed [place] American female with Salinas, Crystal Salinas, Crystal GERD presenting for evaluation of paresthesias.    She initially noticed symptoms of sporadic numbness/tingling of the hands, arms, Crystal feet in October 2018.  This was associated with chest discomfort Crystal generalized shakiness.  She had another episode in April.  These spells last a few minutes Crystal resolve.  If she thinks about her symptoms, she can feel it more.  If she is distracted Crystal staying busy, she is not aware of symptoms.  She was started on Lexapro due to concern of Salinas causing symptoms which she self discontinued.  She is self-medicating with CBD drops which has helped significantly.  She has also seen a cardiologist Crystal had normal evaluation.  She was recently found to have low-normal vitamin B12 Crystal takes vitamin B12 injections.   Her father has multiple sclerosis Crystal cousins have lupus, so she is very concerned about having an autoimmune condition.   Out-side paper records, electronic medical record, Crystal images have been reviewed where available Crystal summarized as:  Lab Results  Component Value Date   VITAMINB12 274 08/10/2017   Lab Results  Component Value Date   TSH 1.12 02/12/2017   Lab Results  Component Value Date   HGBA1C 6.2 09/29/2017     Past Medical History:  Diagnosis Date  . Salinas   . Breast mass    left breast mass    Past Surgical History:  Procedure  Laterality Date  . tubes in ears Left 1990     Medications:  Outpatient Encounter Medications as of 12/13/2017  Medication Sig Note  . LORazepam (ATIVAN) 0.5 MG tablet Take 1 tablet (0.5 mg total) at bedtime by mouth. 07/26/2017: Takes as needed  . Magnesium 250 MG TABS Take 1 tablet every other day by mouth.   . metFORMIN (GLUCOPHAGE) 500 MG tablet Take 1 tablet (500 mg total) by mouth daily with breakfast.   . norgestimate-ethinyl estradiol (ORTHO-CYCLEN,SPRINTEC,PREVIFEM) 0.25-35 MG-MCG tablet Take 1 tablet by mouth daily. (Patient taking differently: Take 1 tablet by mouth at bedtime. )   . Omega-3 Fatty Acids (OMEGA-3 FISH OIL PO) Take 1 capsule daily by mouth.   . ondansetron (ZOFRAN) 4 MG tablet TAKE 1 TABLET EVERY 8 HOURS AS NEEDED FOR NAUSEA & VOMITING   . POTASSIUM CHLORIDE PO Take 1 tablet every other day by mouth.   . ranitidine (ZANTAC) 150 MG tablet Take 1 tablet (150 mg total) by mouth 2 (two) times daily.    No facility-administered encounter medications on file as of 12/13/2017.      Allergies:  Allergies  Allergen Reactions  . Amoxicillin Rash    Family History: Family History  Problem Relation Age of Onset  . Multiple sclerosis Father   . Diabetes Maternal Grandmother   . Emphysema Maternal Grandmother   . Stroke Paternal Grandmother   . Hypertension Paternal Grandmother     Social History: Social History   Tobacco Use  .  Smoking status: Never Smoker  . Smokeless tobacco: Never Used  Substance Use Topics  . Alcohol use: No  . 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   EYES: No visual changes or eye pain ENT: No hearing changes.  No history of nose bleeds.   RESPIRATORY: No cough, wheezing Crystal shortness of breath.   CARDIOVASCULAR: Negative for chest pain, Crystal palpitations.   GI: Negative for abdominal discomfort, blood in stools or black stools.  No recent change in bowel habits.   GU:  No history of incontinence.   MUSCLOSKELETAL: No history of joint pain or swelling.  No myalgias.   SKIN: Negative for lesions, rash, Crystal itching.   HEMATOLOGY/ONCOLOGY: Negative for prolonged bleeding, bruising easily, Crystal swollen nodes.  No history of cancer.   ENDOCRINE: Negative for cold or heat intolerance, polydipsia or goiter.   PSYCH:  No depression +Salinas symptoms.   NEURO: As Above.   Vital Signs:  BP 104/80   Pulse 81   Ht 5\' 7"  (1.702 m)   Wt 278 lb 8 oz (126.3 kg)   SpO2 98%   BMI 43.62 kg/m   General Medical Exam:    General:  Well appearing, comfortable.   Eyes/ENT: see cranial nerve examination.   Neck: No masses appreciated.  Full range of motion without tenderness.  No carotid bruits. Respiratory:  Clear to auscultation, good air entry bilaterally.   Cardiac:  Regular rate Crystal rhythm, no murmur.   Extremities:  No deformities, edema, or skin discoloration.  Skin:  No rashes or lesions.  Neurological Exam: MENTAL STATUS including orientation to time, place, person, recent Crystal remote memory, attention span Crystal concentration, language, Crystal fund of knowledge is normal.  Speech is not dysarthric.  CRANIAL NERVES: II:  No visual field defects.  Unremarkable fundi.   III-IV-VI: Pupils equal round Crystal reactive to light.  Normal conjugate, extra-ocular eye movements in all directions of gaze.  No nystagmus.  No ptosis.   V:  Normal facial sensation.   VII:  Normal facial symmetry Crystal movements.  No pathologic facial reflexes.  VIII:  Normal hearing Crystal vestibular function.   IX-X:  Normal palatal movement.   XI:  Normal  shoulder shrug Crystal head rotation.   XII:  Normal tongue strength Crystal range of motion, no deviation or fasciculation.  MOTOR: Motor strength is 5/5 throughout. No atrophy, fasciculations or abnormal movements.  No pronator drift.  Tone is normal.    MSRs:  Right                                                                 Left brachioradialis 2+  brachioradialis 2+  biceps 2+  biceps 2+  triceps 2+  triceps 2+  patellar 2+  patellar 2+  ankle jerk 2+  ankle jerk 2+  Hoffman no  Hoffman no  plantar response down  plantar response down   SENSORY:  Normal Crystal symmetric perception of light touch, pinprick, vibration, Crystal proprioception.  Romberg's sign absent.   COORDINATION/GAIT: Normal finger-to- nose-finger Crystal heel-to-shin.  Intact rapid alternating movements bilaterally. Gait narrow based Crystal stable. Tandem Crystal stressed gait intact.    IMPRESSION: Mrs. Marciano Sequin is a 37 year-old female referred for evaluation of episodic Crystal migratory paresthesias of the arms Crystal feet.  Symptoms have occurred twice in the setting of chest discomfort Crystal generalized shakiness, what sounds like a panic attack.  Neurological exam is entirely normal Crystal I do not see any worrisome findings on her neurological exam to suggest central or peripheral nervous system pathology.  I reassured patient that with normal exam Crystal migratory symptoms, this is unlikely to be multiple sclerosis Crystal most likely a stress reaction.  She does endorse feeling anxious at times Crystal I have asked her to start making time for her own wellbeing with exercise Crystal mindfulness.  She can discuss pharmacological intervention with her PCP, as needed.  Neurological testing such as NCS/EMG was discussed Crystal given the low positive predictive value in this patient, it was mutually decided not to pursue.  She was very appreciative of the consultation.    Thank you for allowing me to participate in patient's care.  If I can answer any additional  questions, I would be pleased to do so.    Sincerely,    Kiaria Quinnell K. Allena Katz, DO

## 2017-12-13 ENCOUNTER — Ambulatory Visit (INDEPENDENT_AMBULATORY_CARE_PROVIDER_SITE_OTHER): Payer: 59 | Admitting: Neurology

## 2017-12-13 ENCOUNTER — Encounter: Payer: Self-pay | Admitting: Neurology

## 2017-12-13 ENCOUNTER — Ambulatory Visit (INDEPENDENT_AMBULATORY_CARE_PROVIDER_SITE_OTHER): Payer: 59

## 2017-12-13 ENCOUNTER — Encounter

## 2017-12-13 VITALS — BP 104/80 | HR 81 | Ht 67.0 in | Wt 278.5 lb

## 2017-12-13 DIAGNOSIS — R202 Paresthesia of skin: Secondary | ICD-10-CM | POA: Diagnosis not present

## 2017-12-13 DIAGNOSIS — E538 Deficiency of other specified B group vitamins: Secondary | ICD-10-CM

## 2017-12-13 DIAGNOSIS — F419 Anxiety disorder, unspecified: Secondary | ICD-10-CM | POA: Diagnosis not present

## 2017-12-13 MED ORDER — CYANOCOBALAMIN 1000 MCG/ML IJ SOLN
1000.0000 ug | Freq: Once | INTRAMUSCULAR | Status: AC
  Administered 2017-12-13: 1000 ug via INTRAMUSCULAR

## 2017-12-13 NOTE — Patient Instructions (Addendum)
You need to make time and space for yourself.  Start an exercise program or yoga for mindfulness.  Please follow-up with your primary care provider for anxiety

## 2017-12-13 NOTE — Progress Notes (Signed)
Per orders of Dr. Earlene Plater, injection of vitamin B12 1000 mg given in left deltoid by Joseph Art, CMA.  Patient tolerated injection well.

## 2018-01-11 ENCOUNTER — Encounter: Payer: Self-pay | Admitting: Physician Assistant

## 2018-01-12 ENCOUNTER — Ambulatory Visit: Payer: 59 | Admitting: Physician Assistant

## 2018-01-12 ENCOUNTER — Encounter: Payer: Self-pay | Admitting: Physician Assistant

## 2018-01-12 VITALS — BP 110/60 | HR 101 | Temp 99.0°F | Ht 67.0 in | Wt 275.4 lb

## 2018-01-12 DIAGNOSIS — Z23 Encounter for immunization: Secondary | ICD-10-CM | POA: Diagnosis not present

## 2018-01-12 DIAGNOSIS — E538 Deficiency of other specified B group vitamins: Secondary | ICD-10-CM | POA: Diagnosis not present

## 2018-01-12 DIAGNOSIS — R7309 Other abnormal glucose: Secondary | ICD-10-CM

## 2018-01-12 LAB — COMPREHENSIVE METABOLIC PANEL
ALBUMIN: 4.3 g/dL (ref 3.5–5.2)
ALT: 14 U/L (ref 0–35)
AST: 14 U/L (ref 0–37)
Alkaline Phosphatase: 80 U/L (ref 39–117)
BILIRUBIN TOTAL: 0.3 mg/dL (ref 0.2–1.2)
BUN: 11 mg/dL (ref 6–23)
CALCIUM: 9.7 mg/dL (ref 8.4–10.5)
CO2: 32 meq/L (ref 19–32)
CREATININE: 0.8 mg/dL (ref 0.40–1.20)
Chloride: 102 mEq/L (ref 96–112)
GFR: 102.58 mL/min (ref >60.00)
Glucose, Bld: 113 mg/dL — ABNORMAL HIGH (ref 70–99)
Potassium: 3.5 mEq/L (ref 3.5–5.1)
SODIUM: 139 meq/L (ref 135–145)
Total Protein: 7.6 g/dL (ref 6.0–8.3)

## 2018-01-12 LAB — VITAMIN B12: Vitamin B-12: 577 pg/mL (ref 211–911)

## 2018-01-12 LAB — HEMOGLOBIN A1C: Hgb A1c MFr Bld: 5.9 % (ref 4.6–6.5)

## 2018-01-12 NOTE — Patient Instructions (Signed)
It was great to see you!  I will be in touch with your lab results.  Let's follow-up in 3 months, sooner if you have concerns.  Take care,  Jarold Motto PA-C

## 2018-01-12 NOTE — Progress Notes (Signed)
Crystal Salinas is a 37 y.o. female is here to discuss: Vit B12 and Elevated glucose.  I acted as a Neurosurgeon for [employer], PA-C Crystal Mull, LPN  History of Present Illness:   Chief Complaint  Patient presents with  . Hyperglycemia  . Vit B12 level    HPI  Elevated HgbA1c Pt here for follow up on elevated blood sugar. Last A1c was 6.2 in June. Pt is on Metformin 1000 mg daily tolerating well. Continues to work on diet and exercise. Down 3 lb x 1 month.  Wt Readings from Last 5 Encounters:  01/12/18 275 lb 6.1 oz (124.9 kg)  12/13/17 278 lb 8 oz (126.3 kg)  09/29/17 278 lb (126.1 kg)  09/10/17 276 lb 6.1 oz (125.4 kg)  08/26/17 271 lb (122.9 kg)   Vitamin B12 Deficiency Has completed a series of vitamin B12 injections and is taking oral vitamin B12 (she is unsure of dosage.) Denies numbness/tingling.  There are no preventive care reminders to display for this patient.  Past Medical History:  Diagnosis Date  . Anxiety   . Breast mass    left breast mass     Social History   Socioeconomic History  . Marital status: Single    Spouse name: Not on file  . Number of children: Not on file  . Years of education: Not on file  . Highest education level: Not on file  Occupational History  . Not on file  Social Needs  . Financial resource strain: Not on file  . Food insecurity:    Worry: Not on file    Inability: Not on file  . Transportation needs:    Medical: Not on file    Non-medical: Not on file  Tobacco Use  . Smoking status: Never Smoker  . Smokeless tobacco: Never Used  Substance and Sexual Activity  . Alcohol use: No  . Drug use: No  . Sexual activity: Yes    Birth control/protection: Pill    Comment: Mirena  Lifestyle  . Physical activity:    Days per week: Not on file    Minutes per session: Not on file  . Stress: Not on file  Relationships  . Social connections:    Talks on phone: Not on file    Gets together: Not on file    Attends  religious service: Not on file    Active member of club or organization: Not on file    Attends meetings of clubs or organizations: Not on file    Relationship status: Not on file  . Intimate partner violence:    Fear of current or ex partner: Not on file    Emotionally abused: Not on file    Physically abused: Not on file    Forced sexual activity: Not on file  Other Topics Concern  . 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 tubes in ears Left 1990    Family History  Problem Relation Age of Onset  . Multiple sclerosis Father   . Diabetes Maternal Grandmother   .  Emphysema Maternal Grandmother   . Stroke Paternal Grandmother   . Hypertension Paternal Grandmother     PMHx, SurgHx, SocialHx, FamHx, Medications, and Allergies were reviewed in the Visit Navigator and updated as appropriate.   Patient Active Problem List   Diagnosis Date Noted  . Anxiety 09/29/2017  . B12 deficiency 09/29/2017  . PCOS (polycystic ovarian syndrome) 08/19/2009  . Obesity 08/19/2009    Social History   Tobacco Use  . Smoking status: Never Smoker  . Smokeless tobacco: Never Used  Substance Use Topics  . Alcohol use: No  . Drug use: No    Current Medications and Allergies:    Current Outpatient Medications:  .  LORazepam (ATIVAN) 0.5 MG tablet, Take 1 tablet (0.5 mg total) at bedtime by mouth., Disp: 15 tablet, Rfl: 0 .  Magnesium 250 MG TABS, Take 1 tablet every other day by mouth., Disp: , Rfl:  .  metFORMIN (GLUCOPHAGE) 500 MG tablet, Take 1 tablet (500 mg total) by mouth daily with breakfast. (Patient taking differently: Take 1,000 mg  by mouth daily with breakfast. ), Disp: 90 tablet, Rfl: 1 .  norgestimate-ethinyl estradiol (ORTHO-CYCLEN,SPRINTEC,PREVIFEM) 0.25-35 MG-MCG tablet, Take 1 tablet by mouth daily. (Patient taking differently: Take 1 tablet by mouth at bedtime. ), Disp: 1 Package, Rfl: 11 .  Omega-3 Fatty Acids (OMEGA-3 FISH OIL PO), Take 1 capsule daily by mouth., Disp: , Rfl:  .  ondansetron (ZOFRAN) 4 MG tablet, TAKE 1 TABLET EVERY 8 HOURS AS NEEDED FOR NAUSEA & VOMITING, Disp: 20 tablet, Rfl: 0 .  POTASSIUM CHLORIDE PO, Take 1 tablet every other day by mouth., Disp: , Rfl:  .  ranitidine (ZANTAC) 150 MG tablet, Take 1 tablet (150 mg total) by mouth 2 (two) times daily., Disp: 60 tablet, Rfl: 1   Allergies  Allergen Reactions  . Amoxicillin Rash    Review of Systems   ROS  Negative unless otherwise specified per HPI.  Vitals:   Vitals:   01/12/18 1138  BP: 110/60  Pulse: (!) 101  Temp: 99 F (37.2 C)  TempSrc: Oral  SpO2: 95%  Weight: 275 lb 6.1 oz (124.9 kg)  Height: 5\' 7"  (1.702 m)     Body mass index is 43.13 kg/m.   Physical Exam:    Physical Exam  Constitutional: She appears well-developed. She is cooperative.  Non-toxic appearance. She does not have a sickly appearance. She does not appear ill. No distress.  Cardiovascular: Normal rate, regular rhythm, S1 normal, S2 normal, normal heart sounds and normal pulses.  No LE edema  Pulmonary/Chest: Effort normal and breath sounds normal.  Neurological: She is alert. GCS eye subscore is 4. GCS verbal subscore is 5. GCS motor subscore is 6.  Skin: Skin is warm, dry and intact.  Psychiatric: She has a normal mood and affect. Her speech is normal and behavior is normal.  Nursing note and vitals reviewed.    Assessment and Plan:    [name] was seen today for hyperglycemia and vit b12 level.  Diagnoses and all orders for this visit:  Elevated glucose Re-check today. She is doing well with trying to exercise more and eating better.  Will make specific recommendations based on HgbA1c results. -     Hemoglobin A1c -     Comprehensive metabolic panel  Need for prophylactic vaccination and inoculation against influenza -     Flu Vaccine QUAD 36+ mos IM  B12 deficiency -     Vitamin B12  . Reviewed expectations re:  course of current medical issues. . Discussed self-management of symptoms. . Outlined signs and symptoms indicating need for more acute intervention. . Patient verbalized understanding and all questions were answered. . See orders for this visit as documented in the electronic medical record. . Patient received an After Visit Summary.  CMA or LPN served as scribe during this visit. History, Physical, and Plan performed by medical provider. The above documentation has been reviewed and is accurate and complete.  Jarold Motto, PA-C [place] [place] 01/12/2018  Follow-up: No follow-ups on file.

## 2018-01-13 ENCOUNTER — Other Ambulatory Visit (INDEPENDENT_AMBULATORY_CARE_PROVIDER_SITE_OTHER): Payer: 59

## 2018-01-13 ENCOUNTER — Encounter: Payer: Self-pay | Admitting: Physician Assistant

## 2018-01-13 ENCOUNTER — Other Ambulatory Visit: Payer: Self-pay | Admitting: Physician Assistant

## 2018-01-13 DIAGNOSIS — E559 Vitamin D deficiency, unspecified: Secondary | ICD-10-CM

## 2018-01-13 LAB — VITAMIN D 25 HYDROXY (VIT D DEFICIENCY, FRACTURES): VITD: 27.58 ng/mL — AB (ref 30.00–100.00)

## 2018-01-14 ENCOUNTER — Other Ambulatory Visit: Payer: Self-pay | Admitting: Physician Assistant

## 2018-01-14 MED ORDER — METFORMIN HCL 500 MG PO TABS: 500.0000 mg | ORAL_TABLET | Freq: Every day | ORAL | 1 refills | Status: DC

## 2018-05-10 ENCOUNTER — Encounter: Payer: Self-pay | Admitting: Physician Assistant

## 2018-05-10 ENCOUNTER — Ambulatory Visit: Payer: 59 | Admitting: Family Medicine

## 2018-05-10 ENCOUNTER — Ambulatory Visit (INDEPENDENT_AMBULATORY_CARE_PROVIDER_SITE_OTHER): Payer: Self-pay | Admitting: Physician Assistant

## 2018-05-10 VITALS — BP 110/70 | HR 86 | Temp 99.4°F | Ht 67.0 in | Wt 284.4 lb

## 2018-05-10 DIAGNOSIS — R059 Cough, unspecified: Secondary | ICD-10-CM

## 2018-05-10 DIAGNOSIS — R05 Cough: Secondary | ICD-10-CM

## 2018-05-10 MED ORDER — AZITHROMYCIN 250 MG PO TABS: ORAL_TABLET | ORAL | 0 refills | Status: DC

## 2018-05-10 NOTE — Patient Instructions (Signed)
It was great to see you!  You have a viral upper respiratory infection. Antibiotics are not needed for this.  Viral infections usually take 7-10 days to resolve.  The cough can last a few weeks to go away.  If symptoms do not improve, start oral azithromycin  Push fluids and get plenty of rest. Please return if you are not improving as expected, or if you have high fevers (>101.5) or difficulty swallowing or worsening productive cough.  Call clinic with questions.  I hope you start feeling better soon!

## 2018-05-10 NOTE — Progress Notes (Signed)
Crystal Salinas is a 38 y.o. female here for a new problem.  I acted as a Neurosurgeon for [employer], PA-C [name], LPN  History of Present Illness:   Chief Complaint  Patient presents with  . Cough    Cough  This is a new problem. Episode onset: Started on Saturday. The problem has been gradually worsening. The problem occurs every few hours. The cough is productive of sputum (expectorating thick yellow/green). Associated symptoms include chills, a fever (low grade), nasal congestion (green drainage), postnasal drip and a sore throat (Friday). Pertinent negatives include no headaches. The symptoms are aggravated by lying down. She has tried OTC cough suppressant (Robitussin DM, Ziacam, Ibuprofen) for the symptoms. The treatment provided mild relief. Her past medical history is significant for bronchitis. There is no history of asthma or pneumonia.    Multiple family members at home have been sick with URI symptoms.   Past Medical History:  Diagnosis Date  . Anxiety   . Breast mass    left breast mass     Social History   Socioeconomic History  . Marital status: Single    Spouse name: Not on file  . Number of children: Not on file  . Years of education: Not on file  . Highest education level: Not on file  Occupational History  . Not on file  Social Needs  . Financial resource strain: Not on file  . Food insecurity:    Worry: Not on file    Inability: Not on file  . Transportation needs:    Medical: Not on file    Non-medical: Not on file  Tobacco Use  . Smoking status: Never Smoker  . Smokeless tobacco: Never Used  Substance and Sexual Activity  . Alcohol use: No  . Drug use: No  . Sexual activity: Yes    Birth control/protection: Pill    Comment: Mirena  Lifestyle  . Physical activity:    Days per week: Not on file    Minutes per session: Not on file  . Stress: Not on file  Relationships  . Social connections:    Talks on phone: Not on file    Gets  together: Not on file    Attends religious service: Not on file    Active member of club or organization: Not on file    Attends meetings of clubs or organizations: Not on file    Relationship status: Not on file  . Intimate partner violence:    Fear of current or ex partner: Not on file    Emotionally abused: Not on file    Physically abused: Not on file    Forced sexual activity: Not on file  Other Topics Concern  . 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 tubes in ears Left 1990    Family History  Problem Relation Age of Onset  . Multiple sclerosis Father   . Diabetes Maternal Grandmother   . Emphysema Maternal Grandmother   . Stroke Paternal Grandmother   . Hypertension Paternal Grandmother  Allergies  Allergen Reactions  . Amoxicillin Rash    Current Medications:   Current Outpatient Medications:  .  LORazepam (ATIVAN) 0.5 MG tablet, Take 1 tablet (0.5 mg total) at bedtime by mouth., Disp: 15 tablet, Rfl: 0 .  Magnesium 250 MG TABS, Take 1 tablet every other day by mouth., Disp: , Rfl:  .  metFORMIN (GLUCOPHAGE) 500 MG tablet, Take 1 tablet (500 mg total) by mouth daily with breakfast., Disp: 90 tablet, Rfl: 1 .  norgestimate-ethinyl estradiol (ORTHO-CYCLEN,SPRINTEC,PREVIFEM) 0.25-35 MG-MCG tablet, Take 1 tablet by mouth daily. (Patient taking differently: Take 1 tablet by mouth at bedtime. ), Disp: 1 Package, Rfl: 11 .  Omega-3 Fatty Acids (OMEGA-3 FISH OIL PO), Take 1 capsule daily by mouth., Disp: , Rfl:  .  ondansetron (ZOFRAN) 4 MG tablet, TAKE 1 TABLET EVERY 8 HOURS AS NEEDED FOR NAUSEA & VOMITING, Disp: 20  tablet, Rfl: 0 .  POTASSIUM CHLORIDE PO, Take 1 tablet every other day by mouth., Disp: , Rfl:  .  ranitidine (ZANTAC) 150 MG tablet, Take 1 tablet (150 mg total) by mouth 2 (two) times daily., Disp: 60 tablet, Rfl: 1 .  azithromycin (ZITHROMAX) 250 MG tablet, Take two tablets on day 1, then one tablet daily x 4 days, Disp: 6 tablet, Rfl: 0   Review of Systems:   Review of Systems  Constitutional: Positive for chills and fever (low grade).  HENT: Positive for postnasal drip and sore throat (Friday).   Respiratory: Positive for cough.   Neurological: Negative for headaches.    Vitals:   Vitals:   05/10/18 1455  BP: 110/70  Pulse: 86  Temp: 99.4 F (37.4 C)  TempSrc: Oral  SpO2: 97%  Weight: 284 lb 6.1 oz (129 kg)  Height: 5\' 7"  (1.702 m)     Body mass index is 44.54 kg/m.  Physical Exam:   Physical Exam Vitals signs and nursing note reviewed.  Constitutional:      General: She is not in acute distress.    Appearance: She is well-developed. She is not ill-appearing or toxic-appearing.  HENT:     Head: Normocephalic and atraumatic.     Right Ear: Tympanic membrane, ear canal and external ear normal. Tympanic membrane is not erythematous, retracted or bulging.     Left Ear: Tympanic membrane, ear canal and external ear normal. Tympanic membrane is not erythematous, retracted or bulging.     Nose: Mucosal edema, congestion and rhinorrhea present.     Right Sinus: No maxillary sinus tenderness or frontal sinus tenderness.     Left Sinus: No maxillary sinus tenderness or frontal sinus tenderness.     Mouth/Throat:     Lips: Pink.     Mouth: Mucous membranes are moist.     Pharynx: Uvula midline. Posterior oropharyngeal erythema present.     Tonsils: Swelling: 1+ on the right. 1+ on the left.  Eyes:     General: Lids are normal.     Conjunctiva/sclera: Conjunctivae normal.  Neck:     Trachea: Trachea normal.  Cardiovascular:     Rate and Rhythm: Normal rate and regular  rhythm.     Heart sounds: Normal heart sounds, S1 normal and S2 normal.  Pulmonary:     Effort: Pulmonary effort is normal.     Breath sounds: Normal breath sounds. No decreased breath sounds, wheezing, rhonchi or rales.  Lymphadenopathy:     Cervical: No cervical adenopathy.  Skin:    General: Skin is warm and dry.  Neurological:  Mental Status: She is alert.  Psychiatric:        Speech: Speech normal.        Behavior: Behavior normal. Behavior is cooperative.      Assessment and Plan:   [name] was seen today for cough.  Diagnoses and all orders for this visit:  Cough  Other orders -     azithromycin (ZITHROMAX) 250 MG tablet; Take two tablets on day 1, then one tablet daily x 4 days   No red flags on exam. Suspect viral illness. Did provide pocket prescription of azithromycin if symptoms do not improve. Discussed taking medications as prescribed. Reviewed return precautions including worsening fever, SOB, worsening cough or other concerns. Push fluids and rest. I recommend that patient follow-up if symptoms worsen or persist despite treatment x 7-10 days, sooner if needed.   . Reviewed expectations re: course of current medical issues. . Discussed self-management of symptoms. . Outlined signs and symptoms indicating need for more acute intervention. . Patient verbalized understanding and all questions were answered. . See orders for this visit as documented in the electronic medical record. . Patient received an After-Visit Summary.  CMA or LPN served as scribe during this visit. History, Physical, and Plan performed by medical provider. The above documentation has been reviewed and is accurate and complete.   Jarold MottoSamantha Dareth Andrew, PA-C

## 2018-08-01 ENCOUNTER — Telehealth: Payer: Self-pay | Admitting: *Deleted

## 2018-08-01 NOTE — Telephone Encounter (Signed)
Crystal Salinas, please schedule virtual visit for 6 month f/u

## 2018-08-01 NOTE — Telephone Encounter (Signed)
Pt returned call from [place]. FC Line busy x3. Please reach back out to pt.

## 2018-08-01 NOTE — Telephone Encounter (Signed)
Left message on voicemail to call office. Needs to schedule virtual visit with [facility] for 6 month follow up.

## 2018-08-03 ENCOUNTER — Ambulatory Visit (INDEPENDENT_AMBULATORY_CARE_PROVIDER_SITE_OTHER): Payer: Self-pay | Admitting: Physician Assistant

## 2018-08-03 ENCOUNTER — Encounter: Payer: Self-pay | Admitting: Physician Assistant

## 2018-08-03 VITALS — BP 123/88 | HR 104 | Ht 67.0 in | Wt 284.0 lb

## 2018-08-03 DIAGNOSIS — F419 Anxiety disorder, unspecified: Secondary | ICD-10-CM

## 2018-08-03 DIAGNOSIS — E8881 Metabolic syndrome: Secondary | ICD-10-CM

## 2018-08-03 LAB — BASIC METABOLIC PANEL
BUN: 13 mg/dL (ref 6–23)
CO2: 28 mEq/L (ref 19–32)
Calcium: 9.3 mg/dL (ref 8.4–10.5)
Chloride: 103 mEq/L (ref 96–112)
Creatinine, Ser: 0.76 mg/dL (ref 0.40–1.20)
GFR: 102.1 mL/min (ref >60.00)
Glucose, Bld: 109 mg/dL — ABNORMAL HIGH (ref 70–99)
Potassium: 4.4 mEq/L (ref 3.5–5.1)
Sodium: 137 mEq/L (ref 135–145)

## 2018-08-03 LAB — HEMOGLOBIN A1C: Hgb A1c MFr Bld: 6.1 % (ref 4.6–6.5)

## 2018-08-03 NOTE — Progress Notes (Signed)
Virtual Visit via Video   I connected with Crystal Salinas on 08/03/18 at  9:00 AM EDT by a video enabled telemedicine application and verified that I am speaking with the correct person using two identifiers. Location patient: Home Location provider: [facility], Office Persons participating in the virtual visit: Ta, Game, PA-C   I discussed the limitations of evaluation and management by telemedicine and the availability of in person appointments. The patient expressed understanding and agreed to proceed.  **Interactive audio and video telecommunications were attempted between this provider and patient having technical difficulties OR patient did not have access to video capability. We continued and completed visit with audio only.  Subjective:   HPI:  Anxiety Currently doing well with her anxiety. 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 She is sleeping well.  Insulin resistance HgbA1c at last check 6 mo ago was 5.9%. Exercising at least 30 minutes daily on recumbent bike. Weight is currently around 284 lb and this is stable for her. She is going to a nutritionist monthly. Her measurements (waist/hip) are down.  ROS: See pertinent positives and negatives per HPI.  Patient Active Problem List   Diagnosis Date Noted  . Anxiety 09/29/2017  . B12 deficiency 09/29/2017  . PCOS (polycystic ovarian syndrome) 08/19/2009  . Obesity 08/19/2009    Social History   Tobacco Use  . Smoking status: Never Smoker  . Smokeless tobacco: Never Used  Substance Use Topics  . Alcohol use: No    Current Outpatient Medications:  Marland Kitchen  Magnesium 250 MG TABS, Take 1 tablet every other day by mouth., Disp: , Rfl:  .  norgestimate-ethinyl estradiol (ORTHO-CYCLEN,SPRINTEC,PREVIFEM) 0.25-35 MG-MCG tablet, Take 1 tablet by mouth daily. (Patient taking differently: Take 1 tablet by mouth at bedtime. ), Disp: 1  Package, Rfl: 11 .  Omega-3 Fatty Acids (OMEGA-3 FISH OIL PO), Take 1 capsule daily by mouth., Disp: , Rfl:  .  ondansetron (ZOFRAN) 4 MG tablet, TAKE 1 TABLET EVERY 8 HOURS AS NEEDED FOR NAUSEA & VOMITING, Disp: 20 tablet, Rfl: 0 .  LORazepam (ATIVAN) 0.5 MG tablet, Take 1 tablet (0.5 mg total) at bedtime by mouth. (Patient not taking: Reported on 08/03/2018), Disp: 15 tablet, Rfl: 0 .  metFORMIN (GLUCOPHAGE) 500 MG tablet, Take 1 tablet (500 mg total) by mouth daily with breakfast. (Patient not taking: Reported on 08/03/2018), Disp: 90 tablet, Rfl: 1 .  POTASSIUM CHLORIDE PO, Take 1 tablet every other day by mouth., Disp: , Rfl:  .  ranitidine (ZANTAC) 150 MG tablet, Take 1 tablet (150 mg total) by mouth 2 (two) times daily. (Patient not taking: Reported on 08/03/2018), Disp: 60 tablet, Rfl: 1  Allergies  Allergen Reactions  . Amoxicillin Rash    Objective:   VITALS: Per patient if applicable, see vitals. GENERAL: Alert, appears well and in no acute distress. HEENT: Atraumatic, conjunctiva clear, no obvious abnormalities on inspection of external nose and ears. NECK: Normal movements of the head and neck. CARDIOPULMONARY: No increased WOB. Speaking in clear sentences. I:E ratio WNL.  MS: Moves all visible extremities without noticeable abnormality. PSYCH: Pleasant and cooperative, well-groomed. Speech normal rate and rhythm. Affect is appropriate. Insight and judgement are appropriate. Attention is focused, linear, and appropriate.  NEURO: CN grossly intact. Oriented as arrived to appointment on time with no prompting. Moves both UE equally.  SKIN: No obvious lesions, wounds, erythema, or cyanosis  noted on face or hands.  Assessment and Plan:   [name] was seen today for anxiety.  Diagnoses and all orders for this visit:  Anxiety Well controlled. Continue current plan.  Class 3 severe obesity in adult, unspecified BMI, unspecified obesity type, unspecified whether serious comorbidity  present (HCC); Insulin Resistance We will recheck HgbA1c and BMP today. Interventions based on patient's results. F/u in 6 months.   . Reviewed expectations re: course of current medical issues. . Discussed self-management of symptoms. . Outlined signs and symptoms indicating need for more acute intervention. . Patient verbalized understanding and all questions were answered. Marland Kitchen. Health Maintenance issues including appropriate healthy diet, exercise, and smoking avoidance were discussed with patient. . See orders for this visit as documented in the electronic medical record.  I discussed the assessment and treatment plan with the patient. The patient was provided an opportunity to ask questions and all were answered. The patient agreed with the plan and demonstrated an understanding of the instructions.   The patient was advised to call back or seek an in-person evaluation if the symptoms worsen or if the condition fails to improve as anticipated.    Little CreekSamantha Breyana Foll[place]bee, [place] 08/03/2018

## 2018-08-03 NOTE — Addendum Note (Signed)
Addended by: Young Berry T on: 08/03/2018 02:06 PM   Modules accepted: Orders

## 2018-08-04 ENCOUNTER — Encounter: Payer: Self-pay | Admitting: Physician Assistant

## 2018-08-15 ENCOUNTER — Other Ambulatory Visit: Payer: Self-pay | Admitting: Physician Assistant

## 2018-08-15 MED ORDER — METFORMIN HCL 1000 MG PO TABS: 1000.0000 mg | ORAL_TABLET | Freq: Every day | ORAL | 1 refills | Status: DC

## 2018-09-02 ENCOUNTER — Encounter: Payer: Self-pay | Admitting: Physician Assistant

## 2018-09-03 ENCOUNTER — Encounter: Payer: Self-pay | Admitting: Physician Assistant

## 2018-09-16 ENCOUNTER — Encounter: Payer: Self-pay | Admitting: Physician Assistant

## 2018-11-07 ENCOUNTER — Encounter: Payer: Self-pay | Admitting: Physician Assistant

## 2018-11-09 IMAGING — DX DG CHEST 2V
2 series · 2 of 2 positions shown · non-contrast
Comparison: 02/21/2017

CLINICAL DATA: Pt states that she began having CP last night that
comes and goes, has a massage on [REDACTED] and since had bilateral
numbness in both arms that comes and goes. Some neck pain and
headache, denies dizziness. Coughing up green sputum, on
antibiotics. no heart or lung conditions per pt.

EXAM:
CHEST - 2 VIEW

[chest pa]
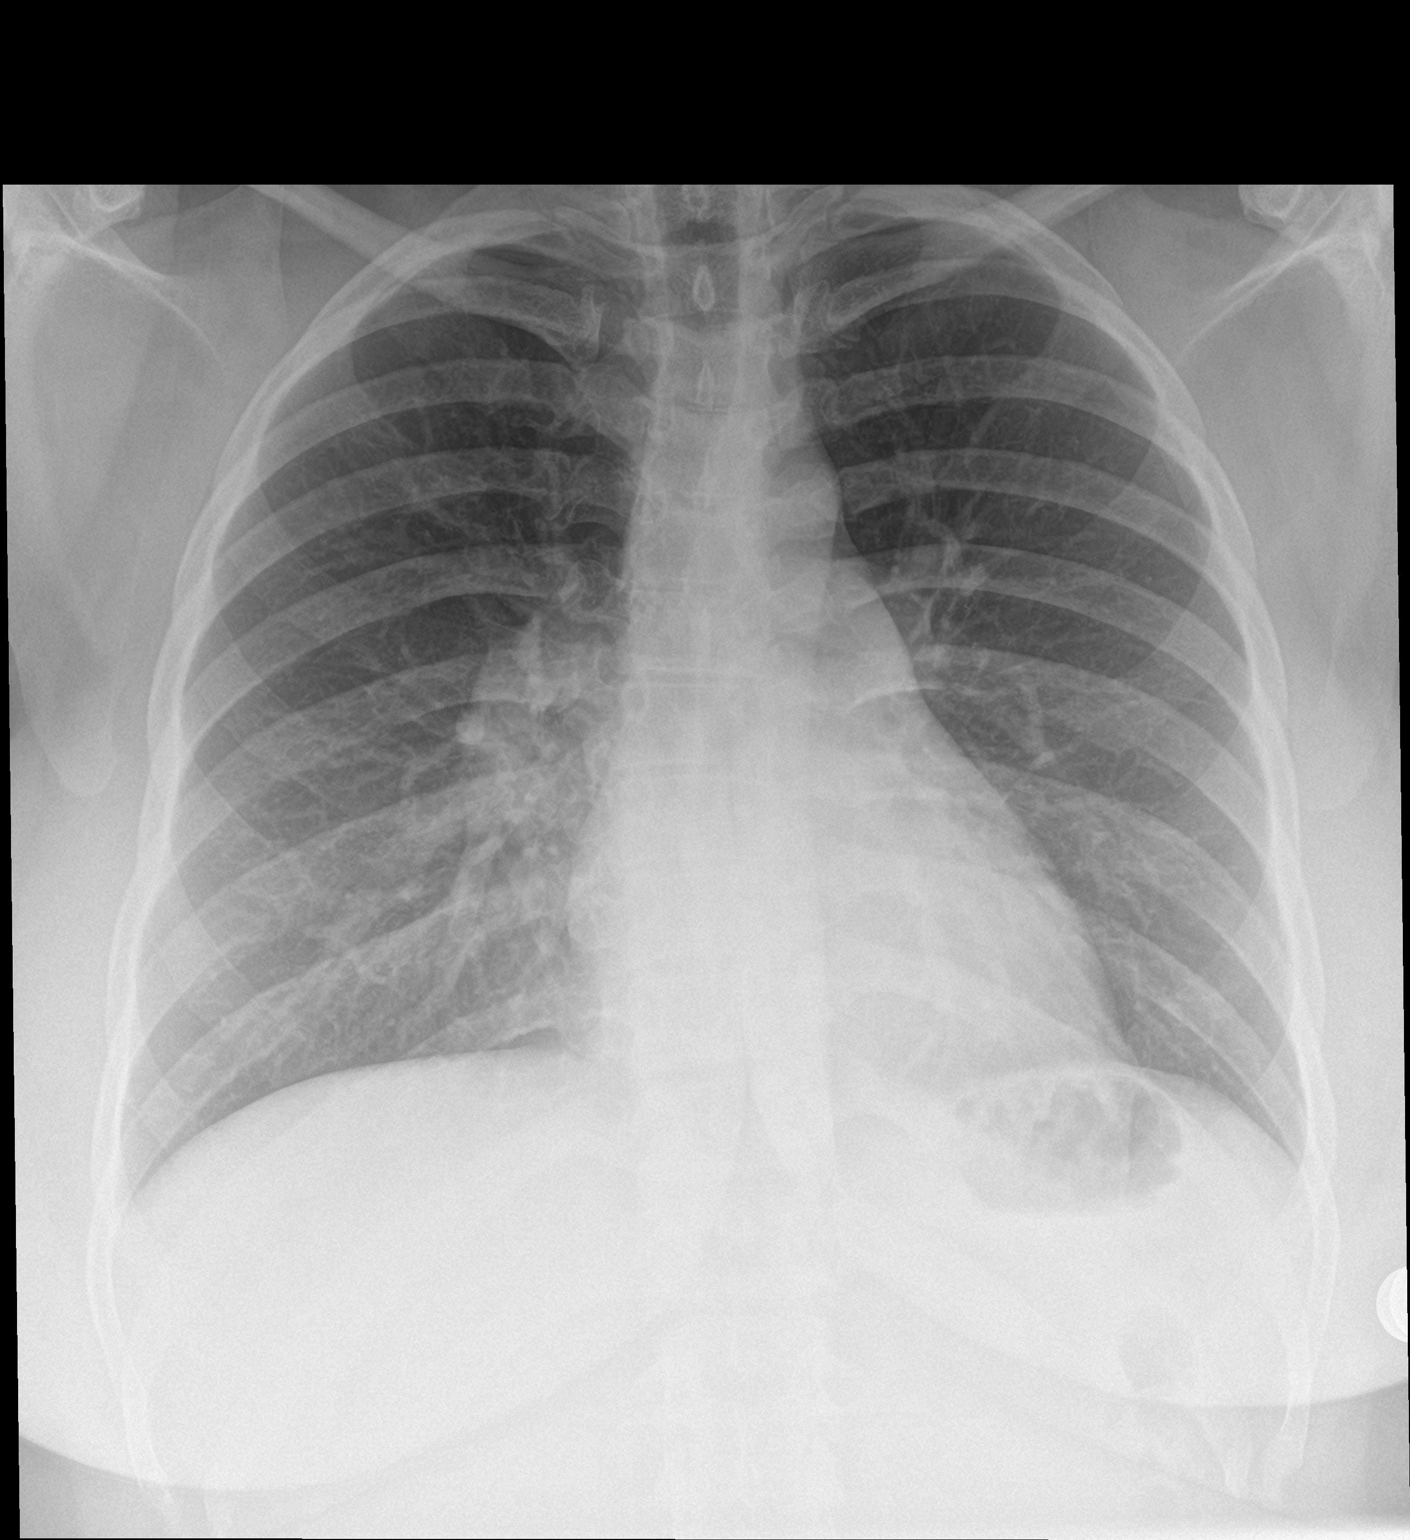

[chest lat]
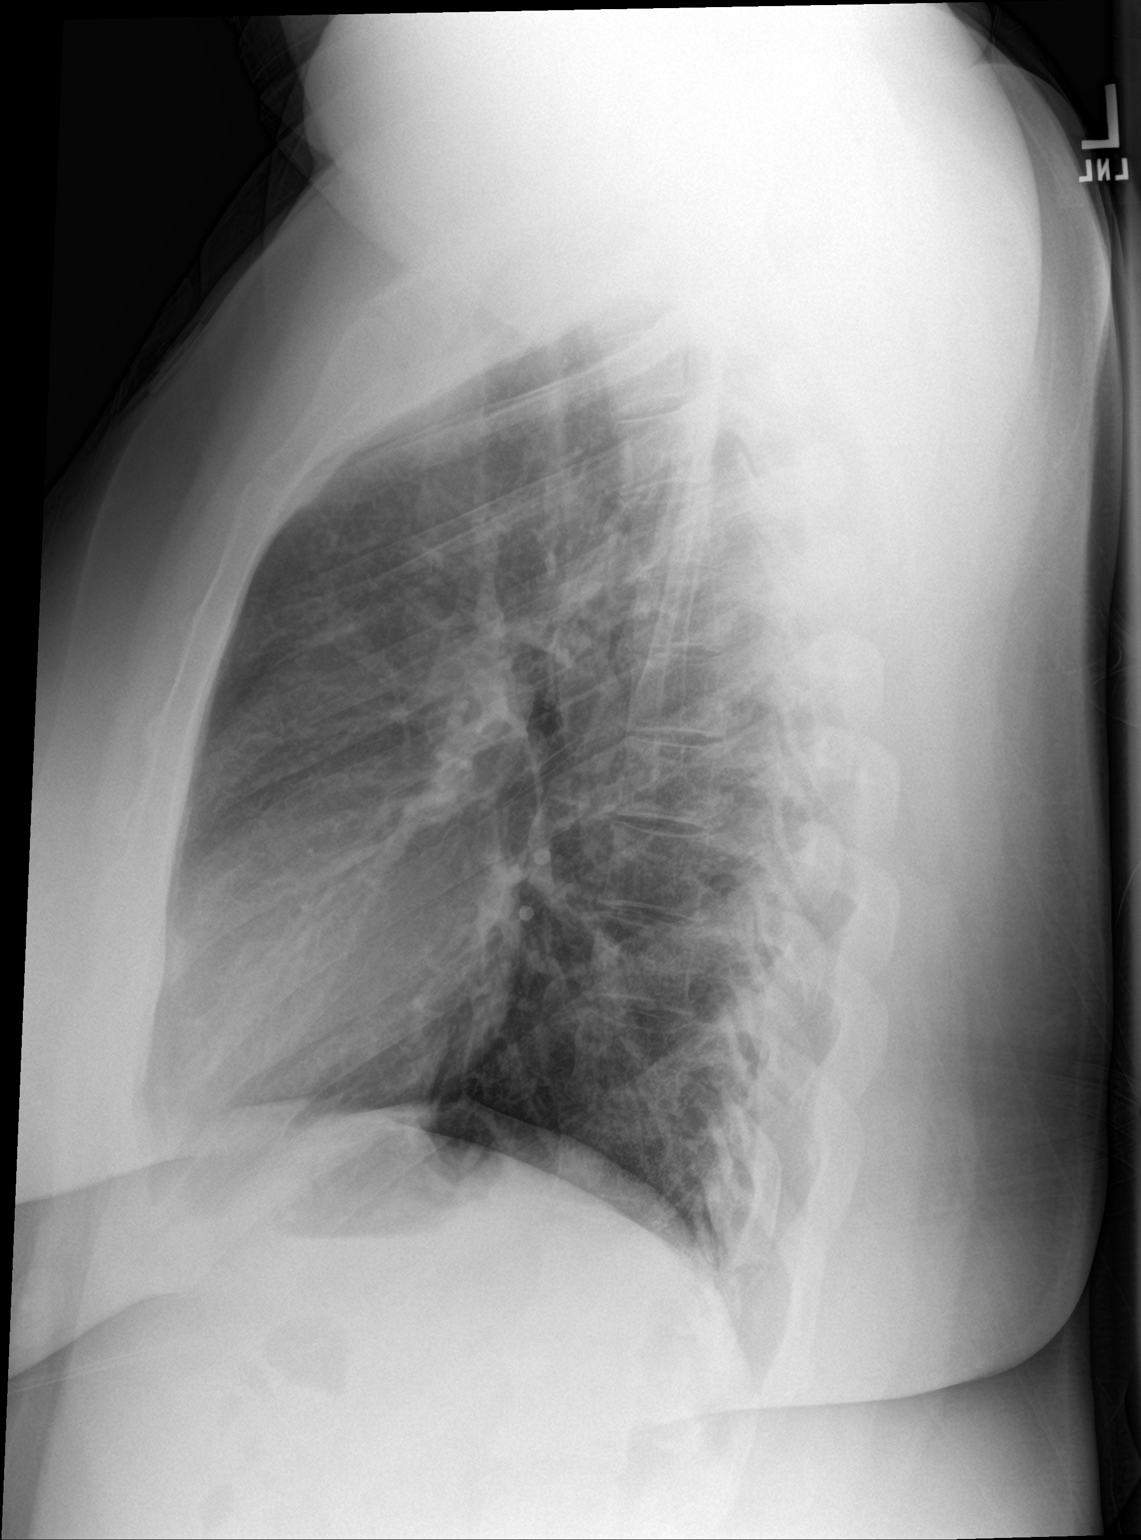

[2 of 2 positions shown; findings below may reference images not displayed]

FINDINGS: Normal heart, mediastinum and hila.

Clear lungs.  No pleural effusion or pneumothorax.

Skeletal structures are unremarkable.
IMPRESSION: No active cardiopulmonary disease.

## 2019-01-03 ENCOUNTER — Other Ambulatory Visit (HOSPITAL_COMMUNITY): Payer: Self-pay | Admitting: *Deleted

## 2019-01-03 DIAGNOSIS — R928 Other abnormal and inconclusive findings on diagnostic imaging of breast: Secondary | ICD-10-CM

## 2019-01-17 ENCOUNTER — Ambulatory Visit (HOSPITAL_COMMUNITY): Payer: PRIVATE HEALTH INSURANCE

## 2019-01-17 ENCOUNTER — Ambulatory Visit (HOSPITAL_COMMUNITY)
Admission: RE | Admit: 2019-01-17 | Discharge: 2019-01-17 | Disposition: A | Discharge status: Home / Self Care | Payer: PRIVATE HEALTH INSURANCE | Source: Ambulatory Visit | Attending: *Deleted | Admitting: *Deleted

## 2019-01-17 ENCOUNTER — Other Ambulatory Visit: Payer: Self-pay

## 2019-01-17 DIAGNOSIS — R928 Other abnormal and inconclusive findings on diagnostic imaging of breast: Secondary | ICD-10-CM | POA: Diagnosis not present

## 2019-01-20 ENCOUNTER — Ambulatory Visit (INDEPENDENT_AMBULATORY_CARE_PROVIDER_SITE_OTHER): Payer: Self-pay | Admitting: Physician Assistant

## 2019-01-20 ENCOUNTER — Encounter: Payer: Self-pay | Admitting: Physician Assistant

## 2019-01-20 ENCOUNTER — Other Ambulatory Visit: Payer: Self-pay

## 2019-01-20 VITALS — BP 124/80 | HR 85 | Temp 98.4°F | Resp 16 | Ht 67.0 in | Wt 270.2 lb

## 2019-01-20 DIAGNOSIS — E559 Vitamin D deficiency, unspecified: Secondary | ICD-10-CM

## 2019-01-20 DIAGNOSIS — E8881 Metabolic syndrome: Secondary | ICD-10-CM

## 2019-01-20 DIAGNOSIS — Z13220 Encounter for screening for lipoid disorders: Secondary | ICD-10-CM

## 2019-01-20 DIAGNOSIS — G4700 Insomnia, unspecified: Secondary | ICD-10-CM

## 2019-01-20 DIAGNOSIS — Z136 Encounter for screening for cardiovascular disorders: Secondary | ICD-10-CM

## 2019-01-20 LAB — CBC WITH DIFFERENTIAL/PLATELET
Basophils Absolute: 0 10*3/uL (ref 0.0–0.1)
Basophils Relative: 0.7 % (ref 0.0–3.0)
Eosinophils Absolute: 0 10*3/uL (ref 0.0–0.7)
Eosinophils Relative: 0.6 % (ref 0.0–5.0)
HCT: 37.3 % (ref 36.0–46.0)
Hemoglobin: 12.2 g/dL (ref 12.0–15.0)
Lymphocytes Relative: 36.8 % (ref 12.0–46.0)
Lymphs Abs: 2.1 10*3/uL (ref 0.7–4.0)
MCHC: 32.8 g/dL (ref 30.0–36.0)
MCV: 77.7 fl — ABNORMAL LOW (ref 78.0–100.0)
Monocytes Absolute: 0.5 10*3/uL (ref 0.1–1.0)
Monocytes Relative: 8.6 % (ref 3.0–12.0)
Neutro Abs: 3 10*3/uL (ref 1.4–7.7)
Neutrophils Relative %: 53.3 % (ref 43.0–77.0)
Platelets: 398 10*3/uL (ref 150.0–400.0)
RBC: 4.8 Mil/uL (ref 3.87–5.11)
RDW: 15.8 % — ABNORMAL HIGH (ref 11.5–15.5)
WBC: 5.7 10*3/uL (ref 4.0–10.5)

## 2019-01-20 LAB — COMPREHENSIVE METABOLIC PANEL
ALT: 14 U/L (ref 0–35)
AST: 14 U/L (ref 0–37)
Albumin: 4.3 g/dL (ref 3.5–5.2)
Alkaline Phosphatase: 72 U/L (ref 39–117)
BUN: 11 mg/dL (ref 6–23)
CO2: 28 mEq/L (ref 19–32)
Calcium: 9.4 mg/dL (ref 8.4–10.5)
Chloride: 104 mEq/L (ref 96–112)
Creatinine, Ser: 0.85 mg/dL (ref 0.40–1.20)
GFR: 89.52 mL/min (ref >60.00)
Glucose, Bld: 104 mg/dL — ABNORMAL HIGH (ref 70–99)
Potassium: 4.4 mEq/L (ref 3.5–5.1)
Sodium: 138 mEq/L (ref 135–145)
Total Bilirubin: 0.4 mg/dL (ref 0.2–1.2)
Total Protein: 6.8 g/dL (ref 6.0–8.3)

## 2019-01-20 LAB — LIPID PANEL
Cholesterol: 202 mg/dL — ABNORMAL HIGH (ref 0–200)
HDL: 39.5 mg/dL (ref >39.00)
LDL Cholesterol: 142 mg/dL — ABNORMAL HIGH (ref 0–99)
NonHDL: 162.71
Total CHOL/HDL Ratio: 5
Triglycerides: 103 mg/dL (ref 0.0–149.0)
VLDL: 20.6 mg/dL (ref 0.0–40.0)

## 2019-01-20 LAB — HEMOGLOBIN A1C: Hgb A1c MFr Bld: 6 % (ref 4.6–6.5)

## 2019-01-20 LAB — VITAMIN D 25 HYDROXY (VIT D DEFICIENCY, FRACTURES): VITD: 37.55 ng/mL (ref 30.00–100.00)

## 2019-01-20 NOTE — Patient Instructions (Signed)
It was great to see you!  Congrats on the weight loss! I will be in touch with your lab results/  Take charge of your sleep!  Consider buying a "[employer]" or even downloading an app "[employer]" is one I have, and see if that will satisfy your husband's need to hear something while sleeping.  Sleep Hygiene  Do: (1) Go to bed at the same time each day. (2) Get up from bed at the same time each day. (3) Get regular exercise each day, preferably in the morning.  There is goof evidence that regular exercise improves restful sleep.  This includes stretching and aerobic exercise. (4) Get regular exposure to outdoor or bright lights, especially in the late afternoon. (5) Keep the temperature in your bedroom comfortable. (6) Keep the bedroom quiet when sleeping. (7) Keep the bedroom dark enough to facilitate sleep. (8) Use your bed only for sleep and sex. (9) Take medications as directed.  It is helpful to take prescribed sleeping pills 1 hour before bedtime, so they are causing drowsiness when you lie down, or 10 hours before getting up, to avoid daytime drowsiness. (10) Use a relaxation exercise just before going to sleep -- imagery, massage, warm bath. (11) Keep your feet and hands warm.  Wear warm socks and/or mittens or gloves to bed.  Don't: (1) Exercise just before going to bed. (2) Engage in stimulating activity just before bed, such as playing a competitive game, watching an exciting program on television, or having an important discussion with a loved one. (3) Have caffeine in the evening (coffee, teas, chocolate, sodas, etc.) (4) Read or watch television in bed. (5) Use alcohol to help you sleep. (6) Go to bed too hungry or too full. (7) Take another person's sleeping pills. (8) Take over-the-counter sleeping pills, without your doctor's knowledge.  Tolerance can develop rapidly with these medications.  Diphenhydramine can have serious side effects for elderly  patients. (9) Take daytime naps. (10) Command yourself to go to sleep.  This only makes your mind and body more alert.  If you lie awake for more than 20-30 minutes, get up, go to a different room, participate in a quiet activity (Ex - non-excitable reading or television), and then return to bed when you feel sleepy.  Do this as many times during the night as needed.  This may cause you to have a night or two of poor sleep but it will train your brain to know when it is time for sleep.    Take care,  Inda Coke PA-C

## 2019-01-20 NOTE — Progress Notes (Signed)
Crystal Salinas is a 38 y.o. female here for a follow up of a pre-existing problem.   History of Present Illness:   Chief Complaint  Patient presents with  . Diabetes    HPI   Insulin Resistance Last HgA1c was 6.1%. She was increased from 500 mg metformin to 1000 mg metformin in May. She has been taking the 1000 mg metformin dose up until July.  In July she did a 30-day cleanse.  Since that time she has been off her Metformin, and following a plant-based diet with very minimal sugars.  She has lost 14 pounds.  She is also exercising regularly with a trainer, does an hour cardio and also some strength training most days of the week.  Wt Readings from Last 5 Encounters:  01/20/19 270 lb 3.2 oz (122.6 kg)  08/03/18 284 lb (128.8 kg)  05/10/18 284 lb 6.1 oz (129 kg)  01/12/18 275 lb 6.1 oz (124.9 kg)  12/13/17 278 lb 8 oz (126.3 kg)   Insomnia Having situational insomnia with a stressful family event.  She also has very poor sleep hygiene, states that her husband has to have either the TV or the radio on while he is sleeping.  She has tried to use apps to help calm her down, but she is still having issues sleeping.  She has Ativan, but does not take it for this.  Vitamin D deficiency Patient reports that she would like her labs checked today.  She does not take vitamin D regularly.  Last vitamin D was 27, approx 1 year ago.  Lipid screening She would like her lipid labs checked.  She is hopeful that her labs have improved due to weight loss and better dietary choices.  Past Medical History:  Diagnosis Date  . Anxiety   . Breast mass    left breast mass     Social History   Socioeconomic History  . Marital status: Single    Spouse name: Not on file  . Number of children: Not on file  . Years of education: Not on file  . Highest education level: Not on file  Occupational History  . Not on file  Social Needs  . Financial resource strain: Not on file  . Food insecurity   Worry: Not on file    Inability: Not on file  . Transportation needs    Medical: Not on file    Non-medical: Not on file  Tobacco Use  . Smoking status: Never Smoker  . Smokeless tobacco: Never Used  Substance and Sexual Activity  . Alcohol use: No  . Drug use: No  . Sexual activity: Yes    Birth control/protection: Pill    Comment: Mirena  Lifestyle  . Physical activity    Days per week: Not on file    Minutes per session: Not on file  . Stress: Not on file  Relationships  . Social Herbalist on phone: Not on file    Gets together: Not on file    Attends religious service: Not on file    Active member of club or organization: Not on file    Attends meetings of clubs or organizations: Not on file    Relationship status: Not on file  . Intimate partner violence    Fear of current or ex partner: Not on file    Emotionally abused: Not on file    Physically abused: Not on file    Forced sexual activity: Not  on file  Other Topics Concern  . 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 tubes in ears Left 1990    Family History  Problem Relation Age of Onset  . Multiple sclerosis Father   . Diabetes Maternal Grandmother   . Emphysema Maternal Grandmother   . Stroke Paternal Grandmother   . Hypertension Paternal Grandmother     Allergies  Allergen Reactions  . Amoxicillin Rash    Current Medications:   Current Outpatient Medications:  .  LORazepam (ATIVAN) 0.5 MG tablet, Take 1 tablet (0.5 mg total) at bedtime by mouth., Disp: 15 tablet, Rfl: 0 .  Magnesium 250 MG TABS, Take 1 tablet every other day by mouth.,  Disp: , Rfl:  .  Omega-3 Fatty Acids (OMEGA-3 FISH OIL PO), Take 1 capsule daily by mouth., Disp: , Rfl:  .  ondansetron (ZOFRAN) 4 MG tablet, TAKE 1 TABLET EVERY 8 HOURS AS NEEDED FOR NAUSEA & VOMITING, Disp: 20 tablet, Rfl: 0 .  POTASSIUM CHLORIDE PO, Take 1 tablet every other day by mouth., Disp: , Rfl:  .  metFORMIN (GLUCOPHAGE) 1000 MG tablet, Take 1 tablet (1,000 mg total) by mouth daily with breakfast. (Patient not taking: Reported on 01/20/2019), Disp: 90 tablet, Rfl: 1 .  norgestimate-ethinyl estradiol (ORTHO-CYCLEN,SPRINTEC,PREVIFEM) 0.25-35 MG-MCG tablet, Take 1 tablet by mouth daily. (Patient not taking: Reported on 01/20/2019), Disp: 1 Package, Rfl: 11 .  ranitidine (ZANTAC) 150 MG tablet, Take 1 tablet (150 mg total) by mouth 2 (two) times daily. (Patient not taking: Reported on 01/20/2019), Disp: 60 tablet, Rfl: 1   Review of Systems:   Review of Systems  Constitutional: Negative for chills, fever, malaise/fatigue and weight loss.  Respiratory: Negative for shortness of breath.   Cardiovascular: Negative for chest pain, orthopnea, claudication and leg swelling.  Gastrointestinal: Negative for heartburn, nausea and vomiting.  Neurological: Negative for dizziness, tingling and headaches.    Vitals:   Vitals:   01/20/19 0811  BP: 124/80  Pulse: 85  Resp: 16  Temp: 98.4 F (36.9 C)  TempSrc: Tympanic  SpO2: 99%  Weight: 270 lb 3.2 oz (122.6 kg)  Height: 5\' 7"  (1.702 m)     Body mass index is 42.32 kg/m.  Physical Exam:   Physical Exam Vitals signs and nursing note reviewed.  Constitutional:      General: She is not in acute distress.    Appearance: She is well-developed. She is not ill-appearing or toxic-appearing.  Cardiovascular:     Rate and Rhythm: Normal rate and regular rhythm.     Pulses: Normal pulses.     Heart sounds: Normal heart sounds, S1 normal and S2 normal.     Comments: No LE edema Pulmonary:     Effort: Pulmonary effort is normal.      Breath sounds: Normal breath sounds.  Skin:    General: Skin is warm and dry.  Neurological:     Mental Status: She is alert.     GCS: GCS eye subscore is 4. GCS verbal subscore is 5. GCS motor subscore is 6.  Psychiatric:        Speech: Speech normal.        Behavior: Behavior normal. Behavior is cooperative.      Assessment and Plan:   [name] was seen today for diabetes.  Diagnoses and all orders for this visit:  Insulin resistance Recheck hemoglobin A1c.  Commended her on weight loss efforts and healthy dietary changes.  Further recommendations based on A1c results. -     CBC with Differential/Platelet -     Comprehensive metabolic panel -     Hemoglobin A1c  Encounter for lipid screening for cardiovascular disease -     Lipid panel  Vitamin D deficiency -     VITAMIN D 25 Hydroxy (Vit-D Deficiency, Fractures)  Insomnia, unspecified type Long discussion regarding this.  Discussed need for adequate sleep hygiene.  Recommended purchasing a white noise machine, or even using a white noise app.  Also consider using a sleep mask for blacking out bluelight from TV.  Follow-up if symptoms do not improve with these interventions.   . Reviewed expectations re: course of current medical issues. . Discussed self-management of symptoms. . Outlined signs and symptoms indicating need for more acute intervention. . Patient verbalized understanding and all questions were answered. . See orders for this visit as documented in the electronic medical record. . Patient received an After-Visit Summary.   Jarold MottoSamantha Latoshia Monrroy, PA-C

## 2019-02-01 ENCOUNTER — Other Ambulatory Visit: Payer: Self-pay

## 2019-02-01 DIAGNOSIS — Z20822 Contact with and (suspected) exposure to covid-19: Secondary | ICD-10-CM

## 2019-02-03 LAB — NOVEL CORONAVIRUS, NAA: SARS-CoV-2, NAA: NOT DETECTED

## 2019-02-05 ENCOUNTER — Telehealth: Payer: Self-pay | Admitting: Physician Assistant

## 2019-02-05 DIAGNOSIS — J069 Acute upper respiratory infection, unspecified: Secondary | ICD-10-CM

## 2019-02-05 MED ORDER — BENZONATATE 100 MG PO CAPS
100.0000 mg | ORAL_CAPSULE | Freq: Two times a day (BID) | ORAL | 0 refills | Status: DC | PRN
Start: 2019-02-05 — End: 2019-07-21

## 2019-02-05 MED ORDER — FLUTICASONE PROPIONATE 50 MCG/ACT NA SUSP: 2.0000 | Freq: Every day | NASAL | 6 refills | Status: DC

## 2019-02-05 NOTE — Progress Notes (Signed)
We are sorry you are not feeling well.  Here is how we plan to help!  Based on what you have shared with me, it looks like you may have a viral upper respiratory infection.  Upper respiratory infections are caused by a large number of viruses; however, rhinovirus is the most common cause.   Symptoms vary from person to person, with common symptoms including sore throat, cough, fatigue or lack of energy and feeling of general discomfort.  A low-grade fever of up to 100.4 may present, but is often uncommon.  Symptoms vary however, and are closely related to a person's age or underlying illnesses.  The most common symptoms associated with an upper respiratory infection are nasal discharge or congestion, cough, sneezing, headache and pressure in the ears and face.  These symptoms usually persist for about 3 to 10 days, but can last up to 2 weeks.  It is important to know that upper respiratory infections do not cause serious illness or complications in most cases.    Upper respiratory infections can be transmitted from person to person, with the most common method of transmission being a person's hands.  The virus is able to live on the skin and can infect other persons for up to 2 hours after direct contact.  Also, these can be transmitted when someone coughs or sneezes; thus, it is important to cover the mouth to reduce this risk.  To keep the spread of the illness at bay, good hand hygiene is very important.  This is an infection that is most likely caused by a virus. There are no specific treatments other than to help you with the symptoms until the infection runs its course.  We are sorry you are not feeling well.  Here is how we plan to help!   For nasal congestion, you may use an oral decongestants such as Mucinex D or if you have glaucoma or high blood pressure use plain Mucinex.  Saline nasal spray or nasal drops can help and can safely be used as often as needed for congestion.  For your congestion,  I have prescribed Fluticasone nasal spray one spray in each nostril twice a day  If you do not have a history of heart disease, hypertension, diabetes or thyroid disease, prostate/bladder issues or glaucoma, you may also use Sudafed to treat nasal congestion.  It is highly recommended that you consult with a pharmacist or your primary care physician to ensure this medication is safe for you to take.     If you have a cough, you may use cough suppressants such as Delsym and Robitussin.  If you have glaucoma or high blood pressure, you can also use Coricidin HBP.   For cough I have prescribed for you A prescription cough medication called Tessalon Perles 100 mg. You may take 1-2 capsules every 8 hours as needed for cough  If you have a sore or scratchy throat, use a saltwater gargle-  to  teaspoon of salt dissolved in a 4-ounce to 8-ounce glass of warm water.  Gargle the solution for approximately 15-30 seconds and then spit.  It is important not to swallow the solution.  You can also use throat lozenges/cough drops and Chloraseptic spray to help with throat pain or discomfort.  Warm or cold liquids can also be helpful in relieving throat pain.  For headache, pain or general discomfort, you can use Ibuprofen or Tylenol as directed.   Some authorities believe that zinc sprays or the use of   Echinacea may shorten the course of your symptoms.   HOME CARE . Only take medications as instructed by your medical team. . Be sure to drink plenty of fluids. Water is fine as well as fruit juices, sodas and electrolyte beverages. You may want to stay away from caffeine or alcohol. If you are nauseated, try taking small sips of liquids. How do you know if you are getting enough fluid? Your urine should be a pale yellow or almost colorless. . Get rest. . Taking a steamy shower or using a humidifier may help nasal congestion and ease sore throat pain. You can place a towel over your head and breathe in the steam  from hot water coming from a faucet. . Using a saline nasal spray works much the same way. . Cough drops, hard candies and sore throat lozenges may ease your cough. . Avoid close contacts especially the very young and the elderly . Cover your mouth if you cough or sneeze . Always remember to wash your hands.   GET HELP RIGHT AWAY IF: . You develop worsening fever. . If your symptoms do not improve within 10 days . You develop yellow or green discharge from your nose over 3 days. . You have coughing fits . You develop a severe head ache or visual changes. . You develop shortness of breath, difficulty breathing or start having chest pain . Your symptoms persist after you have completed your treatment plan  MAKE SURE YOU   Understand these instructions.  Will watch your condition.  Will get help right away if you are not doing well or get worse.  Your e-visit answers were reviewed by a board certified advanced clinical practitioner to complete your personal care plan. Depending upon the condition, your plan could have included both over the counter or prescription medications. Please review your pharmacy choice. If there is a problem, you may call our nursing hot line at and have the prescription routed to another pharmacy. Your safety is important to us. If you have drug allergies check your prescription carefully.   You can use MyChart to ask questions about today's visit, request a non-urgent call back, or ask for a work or school excuse for 24 hours related to this e-Visit. If it has been greater than 24 hours you will need to follow up with your provider, or enter a new e-Visit to address those concerns. You will get an e-mail in the next two days asking about your experience.  I hope that your e-visit has been valuable and will speed your recovery. Thank you for using e-visits.   Greater than 5 minutes, yet less than 10 minutes of time have been spent researching, coordinating, and  implementing care for this patient today    

## 2019-02-06 ENCOUNTER — Telehealth: Payer: Self-pay | Admitting: Physician Assistant

## 2019-02-06 NOTE — Telephone Encounter (Signed)
Crystal Salinas, Please contact patient to schedule

## 2019-02-06 NOTE — Telephone Encounter (Signed)
Please advise. Patient was just seen virtually yesterday  Copied from [place] [id number]. Topic: General - Other >> Feb 06, 2019  2:20 PM Leward Quan A wrote: Reason for CRM: Patient called to say that she had an E visit on 02/05/2019 and was told that she have a URI. She states that she is now wheezing and hear it at night also has lots of coughing causing her to not sleep well at night. Please call patient at Ph#  [phone] or send message on My Chart

## 2019-02-06 NOTE — Telephone Encounter (Signed)
Please call pt and schedule virtual visit with [facility]. She can not prescribe medications without seeing pt.

## 2019-02-07 ENCOUNTER — Encounter: Payer: Self-pay | Admitting: Physician Assistant

## 2019-02-07 ENCOUNTER — Ambulatory Visit (INDEPENDENT_AMBULATORY_CARE_PROVIDER_SITE_OTHER): Payer: Self-pay | Admitting: Physician Assistant

## 2019-02-07 VITALS — Temp 99.9°F

## 2019-02-07 DIAGNOSIS — R05 Cough: Secondary | ICD-10-CM

## 2019-02-07 DIAGNOSIS — R059 Cough, unspecified: Secondary | ICD-10-CM

## 2019-02-07 MED ORDER — PREDNISONE 20 MG PO TABS: 40.0000 mg | ORAL_TABLET | Freq: Every day | ORAL | 0 refills | Status: DC

## 2019-02-07 MED ORDER — AZITHROMYCIN 250 MG PO TABS: ORAL_TABLET | ORAL | 0 refills | Status: DC

## 2019-02-07 NOTE — Progress Notes (Signed)
Virtual Visit via Video   I connected with Crystal Salinas on 02/07/19 at  3:40 PM EST by a video enabled telemedicine application and verified that I am speaking with the correct person using two identifiers. Location patient: Home Location provider: [facility], Office Persons participating in the virtual visit: Crystal Salinas, [name] PA-C, Crystal Mull, LPN   I discussed the limitations of evaluation and management by telemedicine and the availability of in person appointments. The patient expressed understanding and agreed to proceed.  I acted as a Neurosurgeon for Crystal Salinas, [employer], LPN  Subjective:   HPI:   Cough Pt c/o cough, nasal congestion, running nose x 1 week. Was tested for COVID-19 on 10/28 was negative. Had E-Visit on 11/01 was prescribed Flonase and Tessalon Perles but did not pick up at pharmacy did not realize prescriptions were sent in. Having clear nasal drainage and wheezing. Using Mucinex. Able to do 15 minutes of cardio to keep her lungs open.  Denies: fever, chills, diarrhea, SOB  Worst symptom right now for patient is wheezing.   Took prilosec this morning. Doesn't feel like she is well-hydrated.  Patient's last menstrual period was 01/17/2019.  ROS: See pertinent positives and negatives per HPI.  Patient Active Problem List   Diagnosis Date Noted   Anxiety 09/29/2017   B12 deficiency 09/29/2017   PCOS (polycystic ovarian syndrome) 08/19/2009   Obesity 08/19/2009    Social History   Tobacco Use   Smoking status: Never Smoker   Smokeless tobacco: Never Used  Substance Use Topics   Alcohol use: No    Current Outpatient Medications:    LORazepam (ATIVAN) 0.5 MG tablet, Take 1 tablet (0.5 mg total) at bedtime by mouth., Disp: 15 tablet, Rfl: 0   Magnesium 250 MG TABS, Take 1 tablet every other day by mouth., Disp: , Rfl:    metFORMIN (GLUCOPHAGE) 1000 MG tablet, Take 1 tablet (1,000 mg total) by mouth daily  with breakfast., Disp: 90 tablet, Rfl: 1   norgestimate-ethinyl estradiol (ORTHO-CYCLEN,SPRINTEC,PREVIFEM) 0.25-35 MG-MCG tablet, Take 1 tablet by mouth daily., Disp: 1 Package, Rfl: 11   Omega-3 Fatty Acids (OMEGA-3 FISH OIL PO), Take 1 capsule daily by mouth., Disp: , Rfl:    ondansetron (ZOFRAN) 4 MG tablet, TAKE 1 TABLET EVERY 8 HOURS AS NEEDED FOR NAUSEA & VOMITING, Disp: 20 tablet, Rfl: 0   POTASSIUM CHLORIDE PO, Take 1 tablet every other day by mouth., Disp: , Rfl:    azithromycin (ZITHROMAX) 250 MG tablet, Take two tablets on day 1, then daily x 4 days, Disp: 6 tablet, Rfl: 0   benzonatate (TESSALON) 100 MG capsule, Take 1 capsule (100 mg total) by mouth 2 (two) times daily as needed for cough. (Patient not taking: Reported on 02/07/2019), Disp: 20 capsule, Rfl: 0   fluticasone (FLONASE) 50 MCG/ACT nasal spray, Place 2 sprays into both nostrils daily. (Patient not taking: Reported on 02/07/2019), Disp: 16 g, Rfl: 6   predniSONE (DELTASONE) 20 MG tablet, Take 2 tablets (40 mg total) by mouth daily., Disp: 10 tablet, Rfl: 0   ranitidine (ZANTAC) 150 MG tablet, Take 1 tablet (150 mg total) by mouth 2 (two) times daily. (Patient not taking: Reported on 01/20/2019), Disp: 60 tablet, Rfl: 1  Allergies  Allergen Reactions   Amoxicillin Rash    Objective:   VITALS: Per patient if applicable, see vitals. GENERAL: Alert, appears well and in no acute distress. HEENT: Atraumatic, conjunctiva clear, no obvious abnormalities on inspection of  external nose and ears. NECK: Normal movements of the head and neck. CARDIOPULMONARY: No increased WOB. Speaking in clear sentences. I:E ratio WNL.  MS: Moves all visible extremities without noticeable abnormality. PSYCH: Pleasant and cooperative, well-groomed. Speech normal rate and rhythm. Affect is appropriate. Insight and judgement are appropriate. Attention is focused, linear, and appropriate.  NEURO: CN grossly intact. Oriented as arrived to  appointment on time with no prompting. Moves both UE equally.  SKIN: No obvious lesions, wounds, erythema, or cyanosis noted on face or hands.  Assessment and Plan:   [place] was seen today for cough.  Diagnoses and all orders for this visit:  Cough Uncontrolled.  No red flags on discussion, patient is not in any apparent distress.  Suspect possible bronchitis, will trial oral prednisone and Z-Pak.  Discussed taking medications as prescribed. Reviewed return precautions including worsening fever, SOB, worsening cough or other concerns. Push fluids and rest. I recommend that patient follow-up if symptoms worsen or persist despite treatment x 7-10 days, sooner if needed.  Other orders -     predniSONE (DELTASONE) 20 MG tablet; Take 2 tablets (40 mg total) by mouth daily. -     azithromycin (ZITHROMAX) 250 MG tablet; Take two tablets on day 1, then daily x 4 days   Reviewed expectations re: course of current medical issues.  Discussed self-management of symptoms.  Outlined signs and symptoms indicating need for more acute intervention.  Patient verbalized understanding and all questions were answered.  Health Maintenance issues including appropriate healthy diet, exercise, and smoking avoidance were discussed with patient.  See orders for this visit as documented in the electronic medical record.  I discussed the assessment and treatment plan with the patient. The patient was provided an opportunity to ask questions and all were answered. The patient agreed with the plan and demonstrated an understanding of the instructions.   The patient was advised to call back or seek an in-person evaluation if the symptoms worsen or if the condition fails to improve as anticipated.   CMA or LPN served as scribe during this visit. History, Physical, and Plan performed by medical provider. The above documentation has been reviewed and is accurate and complete.   Crystal Salinas, [place] 02/07/2019

## 2019-05-08 ENCOUNTER — Other Ambulatory Visit: Payer: Self-pay

## 2019-05-08 ENCOUNTER — Ambulatory Visit: Payer: Self-pay | Attending: Internal Medicine

## 2019-05-08 DIAGNOSIS — Z20822 Contact with and (suspected) exposure to covid-19: Secondary | ICD-10-CM | POA: Insufficient documentation

## 2019-05-10 ENCOUNTER — Telehealth: Payer: Self-pay | Admitting: *Deleted

## 2019-05-10 LAB — NOVEL CORONAVIRUS, NAA: SARS-CoV-2, NAA: NOT DETECTED

## 2019-05-10 NOTE — Telephone Encounter (Signed)
Patient has questions about exposure protocols and retesting protocols. Patient states her husband is the only + COVID family member- and he has been isolated since symptoms. Patient advised regarding CDC criteria for ending isolation, follow up with symptoms, retesting recommendations reviewed.

## 2019-07-21 ENCOUNTER — Encounter: Payer: Self-pay | Admitting: Physician Assistant

## 2019-07-21 ENCOUNTER — Other Ambulatory Visit: Payer: Self-pay

## 2019-07-21 ENCOUNTER — Ambulatory Visit (INDEPENDENT_AMBULATORY_CARE_PROVIDER_SITE_OTHER): Payer: Self-pay | Admitting: Physician Assistant

## 2019-07-21 VITALS — BP 112/78 | HR 78 | Temp 98.4°F | Ht 67.0 in | Wt 270.0 lb

## 2019-07-21 DIAGNOSIS — E8881 Metabolic syndrome: Secondary | ICD-10-CM

## 2019-07-21 DIAGNOSIS — E785 Hyperlipidemia, unspecified: Secondary | ICD-10-CM

## 2019-07-21 DIAGNOSIS — E559 Vitamin D deficiency, unspecified: Secondary | ICD-10-CM

## 2019-07-21 LAB — LIPID PANEL
Cholesterol: 189 mg/dL (ref 0–200)
HDL: 42 mg/dL (ref >39.00)
LDL Cholesterol: 128 mg/dL — ABNORMAL HIGH (ref 0–99)
NonHDL: 147.41
Total CHOL/HDL Ratio: 5
Triglycerides: 97 mg/dL (ref 0.0–149.0)
VLDL: 19.4 mg/dL (ref 0.0–40.0)

## 2019-07-21 LAB — VITAMIN D 25 HYDROXY (VIT D DEFICIENCY, FRACTURES): VITD: 35.44 ng/mL (ref 30.00–100.00)

## 2019-07-21 LAB — HEMOGLOBIN A1C: Hgb A1c MFr Bld: 5.9 % (ref 4.6–6.5)

## 2019-07-21 NOTE — Progress Notes (Signed)
Crystal Salinas is a 39 y.o. female is here for follow up.  I acted as a Education administrator for [employer], PA-C [employer], [place]  History of Present Illness:   Chief Complaint  Patient presents with  . Insulin resistance    HPI   Insulin resistance Tries to fast until 11am. Working out consistently -- working out at least 6 days a week. Feels much better. Was doing mostly plant based diet, but now has incorporated chicken and fish. Not on Metformin 1000 mg daily.  Wt Readings from Last 3 Encounters:  07/21/19 270 lb (122.5 kg)  01/20/19 270 lb 3.2 oz (122.6 kg)  08/03/18 284 lb (128.8 kg)   Vit D Deficiency Currently on Vit D3 5000 IU daily.  HLD Most recent LDL is 142 from 6 mo ago.    Health Maintenance Due  Topic Date Due  . TETANUS/TDAP  Never done    Past Medical History:  Diagnosis Date  . Anxiety   . Breast mass    left breast mass     Social History   Socioeconomic History  . Marital status: Single    Spouse name: Not on file  . Number of children: Not on file  . Years of education: Not on file  . Highest education level: Not on file  Occupational History  . Not on file  Tobacco Use  . Smoking status: Never Smoker  . Smokeless tobacco: Never Used  Substance and Sexual Activity  . Alcohol use: No  . Drug use: No  . Sexual activity: Yes    Birth control/protection: Pill    Comment: Mirena  Other Topics Concern  . 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 Difficulty of Paying Living Expenses:   Food Insecurity:   . Worried About Charity fundraiser in the Last Year:   . Arboriculturist  in the Last Year:   Transportation Needs:   . Film/video editor (Medical):   Marland Kitchen Lack of Transportation (Non-Medical):   Physical Activity:   . Days of Exercise per Week:   . Minutes of Exercise per Session:   Stress:   . Feeling of Stress :   Social Connections:   . Frequency of Communication with Friends and Family:   . Frequency of Social Gatherings with Friends and Family:   . Attends Religious Services:   . Active Member of Clubs or Organizations:   . Attends Archivist Meetings:   Marland Kitchen Marital Status:   Intimate Partner Violence:   . Fear of Current or Ex-Partner:   . Emotionally Abused:   Marland Kitchen Physically Abused:   . Sexually Abused:     Past Surgical History:  Procedure Laterality Date  . tubes in ears Left 1990    Family History  Problem Relation Age of Onset  . Multiple sclerosis Father   . Diabetes Maternal Grandmother   . Emphysema Maternal Grandmother   . Stroke Paternal Grandmother   . Hypertension Paternal Grandmother     PMHx, SurgHx, SocialHx, FamHx,  Medications, and Allergies were reviewed in the Visit Navigator and updated as appropriate.   Patient Active Problem List   Diagnosis Date Noted  . Anxiety 09/29/2017  . B12 deficiency 09/29/2017  . PCOS (polycystic ovarian syndrome) 08/19/2009  . Obesity 08/19/2009    Social History   Tobacco Use  . Smoking status: Never Smoker  . Smokeless tobacco: Never Used  Substance Use Topics  . Alcohol use: No  . Drug use: No    Current Medications and Allergies:    Current Outpatient Medications:  Marland Kitchen  Magnesium 250 MG TABS, Take 1 tablet every other day by mouth., Disp: , Rfl:  .  Omega-3 Fatty Acids (OMEGA-3 FISH OIL PO), Take 1 capsule daily by mouth., Disp: , Rfl:  .  ondansetron (ZOFRAN) 4 MG tablet, TAKE 1 TABLET EVERY 8 HOURS AS NEEDED FOR NAUSEA & VOMITING, Disp: 20 tablet, Rfl: 0 .  POTASSIUM CHLORIDE PO, Take 1 tablet every other day by mouth., Disp: , Rfl:    Allergies  Allergen  Reactions  . Amoxicillin Rash    Review of Systems   ROS  Negative unless otherwise specified per HPI.  Vitals:   Vitals:   07/21/19 0816  BP: 112/78  Pulse: 78  Temp: 98.4 F (36.9 C)  TempSrc: Temporal  SpO2: 96%  Weight: 270 lb (122.5 kg)  Height: 5\' 7"  (1.702 m)     Body mass index is 42.29 kg/m.   Physical Exam:    Physical Exam Vitals and nursing note reviewed.  Constitutional:      General: She is not in acute distress.    Appearance: She is well-developed. She is not ill-appearing or toxic-appearing.  Cardiovascular:     Rate and Rhythm: Normal rate and regular rhythm.     Pulses: Normal pulses.     Heart sounds: Normal heart sounds, S1 normal and S2 normal.     Comments: No LE edema Pulmonary:     Effort: Pulmonary effort is normal.     Breath sounds: Normal breath sounds.  Skin:    General: Skin is warm and dry.  Neurological:     Mental Status: She is alert.     GCS: GCS eye subscore is 4. GCS verbal subscore is 5. GCS motor subscore is 6.  Psychiatric:        Speech: Speech normal.        Behavior: Behavior normal. Behavior is cooperative.      Assessment and Plan:    Crystal Salinas was seen today for insulin resistance.  Diagnoses and all orders for this visit:  Insulin resistance Update labs today and determine need for medication. Encouraged healthy lifestyle changes that she has already started. -     Hemoglobin A1c  Vitamin D deficiency Update labs today and determine any need for supplement adjustment. -     VITAMIN D 25 Hydroxy (Vit-D Deficiency, Fractures)  Hyperlipidemia, unspecified hyperlipidemia type Update fasting lipid panel today, recalculate ASCVD and provide further recommendations. -     Lipid panel  . Reviewed expectations re: course of current medical issues. . Discussed self-management of symptoms. . Outlined signs and symptoms indicating need for more acute intervention. . Patient verbalized understanding and all  questions were answered. . See orders for this visit as documented in the electronic medical record. . Patient received an After Visit Summary.  CMA or LPN served as scribe during this visit. History, Physical, and Plan performed by medical provider. The above documentation has been reviewed and  is accurate and complete.  Jarold Motto, PA-C [place] [place] 07/21/2019  Follow-up: No follow-ups on file.

## 2019-07-21 NOTE — Patient Instructions (Signed)
It was great to see you!  Keep up the good work, and I will be in touch via Mychart about your lab results.  Take care,  Jarold Motto PA-C

## 2019-11-03 ENCOUNTER — Other Ambulatory Visit: Payer: Self-pay

## 2019-11-03 ENCOUNTER — Ambulatory Visit: Payer: Self-pay | Admitting: Physician Assistant

## 2019-11-03 ENCOUNTER — Encounter: Payer: Self-pay | Admitting: Physician Assistant

## 2019-11-03 ENCOUNTER — Telehealth: Payer: Self-pay

## 2019-11-03 VITALS — BP 110/72 | HR 98 | Temp 98.3°F | Ht 67.0 in | Wt 277.4 lb

## 2019-11-03 DIAGNOSIS — E559 Vitamin D deficiency, unspecified: Secondary | ICD-10-CM

## 2019-11-03 DIAGNOSIS — Z111 Encounter for screening for respiratory tuberculosis: Secondary | ICD-10-CM

## 2019-11-03 DIAGNOSIS — E66813 Obesity, class 3: Secondary | ICD-10-CM

## 2019-11-03 DIAGNOSIS — Z0184 Encounter for antibody response examination: Secondary | ICD-10-CM

## 2019-11-03 DIAGNOSIS — Z13220 Encounter for screening for lipoid disorders: Secondary | ICD-10-CM

## 2019-11-03 DIAGNOSIS — Z136 Encounter for screening for cardiovascular disorders: Secondary | ICD-10-CM

## 2019-11-03 DIAGNOSIS — E88819 Insulin resistance, unspecified: Secondary | ICD-10-CM

## 2019-11-03 DIAGNOSIS — E8881 Metabolic syndrome: Secondary | ICD-10-CM

## 2019-11-03 DIAGNOSIS — Z0000 Encounter for general adult medical examination without abnormal findings: Secondary | ICD-10-CM

## 2019-11-03 NOTE — Telephone Encounter (Signed)
Pt wants the [employer] to be checked to see if there is any record of her tetanus shot

## 2019-11-03 NOTE — Patient Instructions (Signed)
It was great to see you!  Please go to the lab for blood work.   Our office will call you with your results unless you have chosen to receive results via MyChart.  If your blood work is normal we will follow-up each year for physicals and as scheduled for chronic medical problems.  If anything is abnormal we will treat accordingly and get you in for a follow-up.  Take care,  Jeslin Bazinet    Health Maintenance, Female Adopting a healthy lifestyle and getting preventive care are important in promoting health and wellness. Ask your health care provider about:  The right schedule for you to have regular tests and exams.  Things you can do on your own to prevent diseases and keep yourself healthy. What should I know about diet, weight, and exercise? Eat a healthy diet   Eat a diet that includes plenty of vegetables, fruits, low-fat dairy products, and lean protein.  Do not eat a lot of foods that are high in solid fats, added sugars, or sodium. Maintain a healthy weight Body mass index (BMI) is used to identify weight problems. It estimates body fat based on height and weight. Your health care provider can help determine your BMI and help you achieve or maintain a healthy weight. Get regular exercise Get regular exercise. This is one of the most important things you can do for your health. Most adults should:  Exercise for at least 150 minutes each week. The exercise should increase your heart rate and make you sweat (moderate-intensity exercise).  Do strengthening exercises at least twice a week. This is in addition to the moderate-intensity exercise.  Spend less time sitting. Even light physical activity can be beneficial. Watch cholesterol and blood lipids Have your blood tested for lipids and cholesterol at 41 years of age, then have this test every 5 years. Have your cholesterol levels checked more often if:  Your lipid or cholesterol levels are high.  You are older than 40  years of age.  You are at high risk for heart disease. What should I know about cancer screening? Depending on your health history and family history, you may need to have cancer screening at various ages. This may include screening for:  Breast cancer.  Cervical cancer.  Colorectal cancer.  Skin cancer.  Lung cancer. What should I know about heart disease, diabetes, and high blood pressure? Blood pressure and heart disease  High blood pressure causes heart disease and increases the risk of stroke. This is more likely to develop in people who have high blood pressure readings, are of African descent, or are overweight.  Have your blood pressure checked: ? Every 3-5 years if you are 18-39 years of age. ? Every year if you are 40 years old or older. Diabetes Have regular diabetes screenings. This checks your fasting blood sugar level. Have the screening done:  Once every three years after age 40 if you are at a normal weight and have a low risk for diabetes.  More often and at a younger age if you are overweight or have a high risk for diabetes. What should I know about preventing infection? Hepatitis B If you have a higher risk for hepatitis B, you should be screened for this virus. Talk with your health care provider to find out if you are at risk for hepatitis B infection. Hepatitis C Testing is recommended for:  Everyone born from 1945 through 1965.  Anyone with known risk factors for hepatitis C. Sexually   transmitted infections (STIs)  Get screened for STIs, including gonorrhea and chlamydia, if: ? You are sexually active and are younger than 41 years of age. ? You are older than 41 years of age and your health care provider tells you that you are at risk for this type of infection. ? Your sexual activity has changed since you were last screened, and you are at increased risk for chlamydia or gonorrhea. Ask your health care provider if you are at risk.  Ask your health  care provider about whether you are at high risk for HIV. Your health care provider may recommend a prescription medicine to help prevent HIV infection. If you choose to take medicine to prevent HIV, you should first get tested for HIV. You should then be tested every 3 months for as long as you are taking the medicine. Pregnancy  If you are about to stop having your period (premenopausal) and you may become pregnant, seek counseling before you get pregnant.  Take 400 to 800 micrograms (mcg) of folic acid every day if you become pregnant.  Ask for birth control (contraception) if you want to prevent pregnancy. Osteoporosis and menopause Osteoporosis is a disease in which the bones lose minerals and strength with aging. This can result in bone fractures. If you are 65 years old or older, or if you are at risk for osteoporosis and fractures, ask your health care provider if you should:  Be screened for bone loss.  Take a calcium or vitamin D supplement to lower your risk of fractures.  Be given hormone replacement therapy (HRT) to treat symptoms of menopause. Follow these instructions at home: Lifestyle  Do not use any products that contain nicotine or tobacco, such as cigarettes, e-cigarettes, and chewing tobacco. If you need help quitting, ask your health care provider.  Do not use street drugs.  Do not share needles.  Ask your health care provider for help if you need support or information about quitting drugs. Alcohol use  Do not drink alcohol if: ? Your health care provider tells you not to drink. ? You are pregnant, may be pregnant, or are planning to become pregnant.  If you drink alcohol: ? Limit how much you use to 0-1 drink a day. ? Limit intake if you are breastfeeding.  Be aware of how much alcohol is in your drink. In the [place] one drink equals one 12 oz bottle of beer (355 mL), one 5 oz glass of wine (148 mL), or one 1 oz glass of hard liquor (44 mL). General  instructions  Schedule regular health, dental, and eye exams.  Stay current with your vaccines.  Tell your health care provider if: ? You often feel depressed. ? You have ever been abused or do not feel safe at home. Summary  Adopting a healthy lifestyle and getting preventive care are important in promoting health and wellness.  Follow your health care provider's instructions about healthy diet, exercising, and getting tested or screened for diseases.  Follow your health care provider's instructions on monitoring your cholesterol and blood pressure. This information is not intended to replace advice given to you by your health care provider. Make sure you discuss any questions you have with your health care provider. Document Revised: 03/16/2018 Document Reviewed: 03/16/2018 Elsevier Patient Education  2020 [employer]  

## 2019-11-03 NOTE — Progress Notes (Signed)
I acted as a Neurosurgeon for [employer], PA-C [name], LPN  Subjective:    Crystal Salinas is a 39 y.o. female and is here for a comprehensive physical exam.   HPI  Health Maintenance Due  Topic Date Due  . Hepatitis C Screening  Never done  . TETANUS/TDAP  Never done   Applied for LPN --> RN transition through [employer].  Acute Concerns: Immunity status testing -- needs to have her vaccine status checked for school  Chronic Issues: Anxiety -- well controlled. Currently not on any medications. Feels like can be related to her diet, especially sugary things. Does deep breathing exercises. PCOS/insulin resistance -- overall feels well controlled in regards to her periods when she eats right and exercises regularly.  Health Maintenance: Immunizations -- Needs Tdap will check with health at work; is planning to get the COVID vaccine. Colonoscopy -- N/A Mammogram -- UTD, due 10/21 PAP -- UTD, due 5/22 Bone Density -- N/A Diet -- trying to continue clean up her diet; still seeing a nutritionist Sleep habits -- overall good; working on good sleep hygiene Exercise -- working out 6 days per week Weight -- Weight: (!) 277 lb 6.1 oz (125.8 kg)  Mood -- overall stable Weight history: Wt Readings from Last 10 Encounters:  11/03/19 (!) 277 lb 6.1 oz (125.8 kg)  07/21/19 270 lb (122.5 kg)  01/20/19 270 lb 3.2 oz (122.6 kg)  08/03/18 284 lb (128.8 kg)  05/10/18 284 lb 6.1 oz (129 kg)  01/12/18 275 lb 6.1 oz (124.9 kg)  12/13/17 278 lb 8 oz (126.3 kg)  09/29/17 278 lb (126.1 kg)  09/10/17 276 lb 6.1 oz (125.4 kg)  08/26/17 271 lb (122.9 kg)   Body mass index is 43.44 kg/m. Patient's last menstrual period was 10/18/2019 (approximate). Period characteristics: overall controlled Alcohol use: none Tobacco use: none  Depression screen PHQ 2/9 07/21/2019  Decreased Interest 0  Down, Depressed, Hopeless 0  PHQ - 2 Score 0     Other  providers/specialists: Patient Care Team: Jarold Motto, [place] as PCP - General (Physician Assistant)    PMHx, SurgHx, SocialHx, Medications, and Allergies were reviewed in the Visit Navigator and updated as appropriate.   Past Medical History:  Diagnosis Date  . Anxiety   . Breast mass    left breast mass     Past Surgical History:  Procedure Laterality Date  . tubes in ears Left 1990     Family History  Problem Relation Age of Onset  . Multiple sclerosis Father   . Diabetes Maternal Grandmother   . Emphysema Maternal Grandmother   . Stroke Paternal Grandmother   . Hypertension Paternal Grandmother     Social History   Tobacco Use  . Smoking status: Never Smoker  . Smokeless tobacco: Never Used  Vaping Use  . Vaping Use: Never used  Substance Use Topics  . Alcohol use: No  . Drug use: No    Review of Systems:   Review of Systems  Constitutional: Negative for chills, fever, malaise/fatigue and weight loss.  HENT: Negative for hearing loss, sinus pain and sore throat.   Respiratory: Negative for cough and hemoptysis.   Cardiovascular: Negative for chest pain, palpitations, leg swelling and PND.  Gastrointestinal: Negative for abdominal pain, constipation, diarrhea, heartburn, nausea and vomiting.  Genitourinary: Negative for dysuria, frequency and urgency.  Musculoskeletal: Negative for back pain, myalgias and neck pain.  Skin: Negative for itching and rash.  Neurological: Negative for  dizziness, tingling, seizures and headaches.  Endo/Heme/Allergies: Negative for polydipsia.  Psychiatric/Behavioral: Negative for depression. The patient is nervous/anxious. The patient does not have insomnia.     Objective:   BP 110/72 (BP Location: Left Arm, Patient Position: Sitting, Cuff Size: Large)   Pulse 98   Temp 98.3 F (36.8 C) (Temporal)   Ht 5\' 7"  (1.702 m)   Wt (!) 277 lb 6.1 oz (125.8 kg)   LMP 10/18/2019 (Approximate)   SpO2 96%   BMI 43.44 kg/m  Body  mass index is 43.44 kg/m.   General Appearance:    Alert, cooperative, no distress, appears stated age  Head:    Normocephalic, without obvious abnormality, atraumatic  Eyes:    PERRL, conjunctiva/corneas clear, EOM's intact, fundi    benign, both eyes  Ears:    Normal TM's and external ear canals, both ears  Nose:   Nares normal, septum midline, mucosa normal, no drainage    or sinus tenderness  Throat:   Lips, mucosa, and tongue normal; teeth and gums normal  Neck:   Supple, symmetrical, trachea midline, no adenopathy;    thyroid:  no enlargement/tenderness/nodules; no carotid   bruit or JVD  Back:     Symmetric, no curvature, ROM normal, no CVA tenderness  Lungs:     Clear to auscultation bilaterally, respirations unlabored  Chest Wall:    No tenderness or deformity   Heart:    Regular rate and rhythm, S1 and S2 normal, no murmur, rub or gallop  Breast Exam:    Deferred  Abdomen:     Soft, non-tender, bowel sounds active all four quadrants,    no masses, no organomegaly  Genitalia:    Deferred  Extremities:   Extremities normal, atraumatic, no cyanosis or edema  Pulses:   2+ and symmetric all extremities  Skin:   Skin color, texture, turgor normal, no rashes or lesions  Lymph nodes:   Cervical, supraclavicular, and axillary nodes normal  Neurologic:   CNII-XII intact, normal strength, sensation and reflexes    throughout     Assessment/Plan:   [name] was seen today for annual exam.  Diagnoses and all orders for this visit:  Routine physical examination Today patient counseled on age appropriate routine health concerns for screening and prevention, each reviewed and up to date or declined. Immunizations reviewed and up to date or declined. Labs ordered and reviewed. Risk factors for depression reviewed and negative. Hearing function and visual acuity are intact. ADLs screened and addressed as needed. Functional ability and level of safety reviewed and appropriate. Education,  counseling and referrals performed based on assessed risks today. Patient provided with a copy of personalized plan for preventive services.  Vitamin D deficiency -     VITAMIN D 25 Hydroxy (Vit-D Deficiency, Fractures); Future  Insulin resistance/PCOS Will update HgbA1c today. Continued efforts on diet and exercise. -     Hemoglobin A1c; Future  Screening for tuberculosis -     QuantiFERON-TB Gold Plus; Future  Encounter for lipid screening for cardiovascular disease -     Lipid panel; Future  Class 3 severe obesity in adult, unspecified BMI, unspecified obesity type, unspecified whether serious comorbidity present (HCC) Encouraged continued diet and exercise efforts. -     CBC with Differential/Platelet; Future -     Comprehensive metabolic panel; Future  Immunity status testing -     Measles/Mumps/Rubella Immunity; Future -     Varicella zoster antibody, IgG; Future     Well Adult Exam:  Labs ordered: Yes. Patient counseling was done. See below for items discussed. Discussed the patient's BMI.  The BMI is not in the acceptable range; BMI management plan is completed Follow up in one year. Breast cancer screening: UTD. Cervical cancer screening: UTD   Patient Counseling: [x]    Nutrition: Stressed importance of moderation in sodium/caffeine intake, saturated fat and cholesterol, caloric balance, sufficient intake of fresh fruits, vegetables, fiber, calcium, iron, and 1 mg of folate supplement per day (for females capable of pregnancy).  [x]    Stressed the importance of regular exercise.   [x]    Substance Abuse: Discussed cessation/primary prevention of tobacco, alcohol, or other drug use; driving or other dangerous activities under the influence; availability of treatment for abuse.   [x]    Injury prevention: Discussed safety belts, safety helmets, smoke detector, smoking near bedding or upholstery.   [x]    Sexuality: Discussed sexually transmitted diseases, partner selection, use of  condoms, avoidance of unintended pregnancy  and contraceptive alternatives.  [x]    Dental health: Discussed importance of regular tooth brushing, flossing, and dental visits.  [x]    Health maintenance and immunizations reviewed. Please refer to Health maintenance section.   CMA or LPN served as scribe during this visit. History, Physical, and Plan performed by medical provider. The above documentation has been reviewed and is accurate and complete.   , PA-C [facility]

## 2019-11-05 LAB — QUANTIFERON-TB GOLD PLUS
Mitogen-NIL: >10 IU/mL
NIL: 0.05 IU/mL
QuantiFERON-TB Gold Plus: NEGATIVE
TB1-NIL: 0.01 IU/mL
TB2-NIL: 0.01 IU/mL

## 2019-11-06 ENCOUNTER — Encounter: Payer: Self-pay | Admitting: Physician Assistant

## 2019-11-06 LAB — COMPREHENSIVE METABOLIC PANEL
AG Ratio: 1.6 (calc) (ref 1.0–2.5)
ALT: 20 U/L (ref 6–29)
AST: 22 U/L (ref 10–30)
Albumin: 4.4 g/dL (ref 3.6–5.1)
Alkaline phosphatase (APISO): 83 U/L (ref 31–125)
BUN: 18 mg/dL (ref 7–25)
CO2: 26 mmol/L (ref 20–32)
Calcium: 9.5 mg/dL (ref 8.6–10.2)
Chloride: 103 mmol/L (ref 98–110)
Creat: 1.06 mg/dL (ref 0.50–1.10)
Globulin: 2.7 g/dL (calc) (ref 1.9–3.7)
Glucose, Bld: 83 mg/dL (ref 65–99)
Potassium: 4.5 mmol/L (ref 3.5–5.3)
Sodium: 137 mmol/L (ref 135–146)
Total Bilirubin: 0.3 mg/dL (ref 0.2–1.2)
Total Protein: 7.1 g/dL (ref 6.1–8.1)

## 2019-11-06 LAB — CBC WITH DIFFERENTIAL/PLATELET
Absolute Monocytes: 488 cells/uL (ref 200–950)
Basophils Absolute: 33 cells/uL (ref 0–200)
Basophils Relative: 0.5 %
Eosinophils Absolute: 20 cells/uL (ref 15–500)
Eosinophils Relative: 0.3 %
HCT: 40.4 % (ref 35.0–45.0)
Hemoglobin: 12.6 g/dL (ref 11.7–15.5)
Lymphs Abs: 2429 cells/uL (ref 850–3900)
MCH: 24.6 pg — ABNORMAL LOW (ref 27.0–33.0)
MCHC: 31.2 g/dL — ABNORMAL LOW (ref 32.0–36.0)
MCV: 78.9 fL — ABNORMAL LOW (ref 80.0–100.0)
MPV: 10.1 fL (ref 7.5–12.5)
Monocytes Relative: 7.4 %
Neutro Abs: 3630 cells/uL (ref 1500–7800)
Neutrophils Relative %: 55 %
Platelets: 426 10*3/uL — ABNORMAL HIGH (ref 140–400)
RBC: 5.12 10*6/uL — ABNORMAL HIGH (ref 3.80–5.10)
RDW: 14.2 % (ref 11.0–15.0)
Total Lymphocyte: 36.8 %
WBC: 6.6 10*3/uL (ref 3.8–10.8)

## 2019-11-06 LAB — LIPID PANEL
Cholesterol: 210 mg/dL — ABNORMAL HIGH (ref <200)
HDL: 49 mg/dL — ABNORMAL LOW (ref >=50)
LDL Cholesterol (Calc): 134 mg/dL (calc) — ABNORMAL HIGH
Non-HDL Cholesterol (Calc): 161 mg/dL (calc) — ABNORMAL HIGH (ref <130)
Total CHOL/HDL Ratio: 4.3 (calc) (ref <5.0)
Triglycerides: 144 mg/dL (ref <150)

## 2019-11-06 LAB — HEMOGLOBIN A1C
Hgb A1c MFr Bld: 5.7 % of total Hgb — ABNORMAL HIGH (ref <5.7)
Mean Plasma Glucose: 117 (calc)
eAG (mmol/L): 6.5 (calc)

## 2019-11-06 LAB — MEASLES/MUMPS/RUBELLA IMMUNITY
Mumps IgG: <9 AU/mL — ABNORMAL LOW
Rubella: 1.79 Index
Rubeola IgG: >300 AU/mL

## 2019-11-06 LAB — VARICELLA ZOSTER ANTIBODY, IGG: Varicella IgG: 394.7 index

## 2019-11-06 LAB — VITAMIN D 25 HYDROXY (VIT D DEFICIENCY, FRACTURES): Vit D, 25-Hydroxy: 35 ng/mL (ref 30–100)

## 2019-11-06 NOTE — Telephone Encounter (Signed)
Spoke to pt told her I checked the [facility] and last Tetanus was in 1998. Pt verbalized understanding and has questions about vaccinations and why she is not immune after all these years. Told pt I would suggest if she has questions to make an appointment. Pt verbalized understanding.

## 2019-11-06 NOTE — Telephone Encounter (Signed)
See other message in result notes

## 2019-11-09 ENCOUNTER — Other Ambulatory Visit: Payer: Self-pay

## 2019-11-09 ENCOUNTER — Ambulatory Visit (INDEPENDENT_AMBULATORY_CARE_PROVIDER_SITE_OTHER): Payer: Self-pay

## 2019-11-09 DIAGNOSIS — Z23 Encounter for immunization: Secondary | ICD-10-CM

## 2019-11-09 NOTE — Progress Notes (Signed)
Crystal Salinas 39 y.o. female presents to office today for MMR and Tdap vaccinations per Samantha Worley, PA. Administered Tdap 0.5 mL IM right arm. Patient tolerated well. Administered MMR IM 0.5 mL left arm. Patient tolerated well. 

## 2019-11-13 ENCOUNTER — Encounter: Payer: Self-pay | Admitting: Physician Assistant

## 2020-03-16 ENCOUNTER — Other Ambulatory Visit: Payer: Self-pay

## 2020-03-16 DIAGNOSIS — Z20822 Contact with and (suspected) exposure to covid-19: Secondary | ICD-10-CM

## 2020-03-18 LAB — NOVEL CORONAVIRUS, NAA: SARS-CoV-2, NAA: NOT DETECTED

## 2020-03-18 LAB — SARS-COV-2, NAA 2 DAY TAT

## 2020-04-02 ENCOUNTER — Other Ambulatory Visit (HOSPITAL_COMMUNITY): Payer: Self-pay | Admitting: *Deleted

## 2020-04-02 DIAGNOSIS — Z1231 Encounter for screening mammogram for malignant neoplasm of breast: Secondary | ICD-10-CM

## 2020-04-08 ENCOUNTER — Telehealth (INDEPENDENT_AMBULATORY_CARE_PROVIDER_SITE_OTHER): Payer: Self-pay | Admitting: Family Medicine

## 2020-04-08 ENCOUNTER — Encounter: Payer: Self-pay | Admitting: Family Medicine

## 2020-04-08 VITALS — Ht 67.0 in | Wt 277.3 lb

## 2020-04-08 DIAGNOSIS — R059 Cough, unspecified: Secondary | ICD-10-CM

## 2020-04-08 DIAGNOSIS — R509 Fever, unspecified: Secondary | ICD-10-CM

## 2020-04-08 LAB — POCT INFLUENZA A/B
Influenza A, POC: NEGATIVE
Influenza B, POC: NEGATIVE

## 2020-04-08 NOTE — Progress Notes (Signed)
Phone [phone] Virtual visit via Video note   Subjective:  Chief complaint: Chief Complaint  Patient presents with  . Cough   This visit type was conducted due to national recommendations for restrictions regarding the COVID-19 Pandemic (e.g. social distancing).  This format is felt to be most appropriate for this patient at this time balancing risks to patient and risks to population by having him in for in person visit.  No physical exam was performed (except for noted visual exam or audio findings with Telehealth visits).    Our team/I connected with Crystal Salinas at  3:00 PM EST by a video enabled telemedicine application ([email or web address] or caregility through epic) and verified that I am speaking with the correct person using two identifiers.  Location patient: [place] Location provider: [facility], office Persons participating in the virtual visit:  patient  Our team/I discussed the limitations of evaluation and management by telemedicine and the availability of in person appointments. In light of current covid-19 pandemic, patient also understands that we are trying to protect them by minimizing in office contact if at all possible.  The patient expressed consent for telemedicine visit and agreed to proceed. Patient understands insurance will be billed.   Past Medical History-  Patient Active Problem List   Diagnosis Date Noted  . Anxiety 09/29/2017  . B12 deficiency 09/29/2017  . PCOS (polycystic ovarian syndrome) 08/19/2009  . Obesity 08/19/2009    Medications- reviewed and updated Current Outpatient Medications  Medication Sig Dispense Refill  . Ascorbic Acid (VITAMIN C) 500 MG CAPS Take 1 capsule by mouth daily.    . Cholecalciferol (VITAMIN D3) 250 MCG (10000 UT) TABS Take 1 tablet by mouth daily.    . Magnesium 250 MG TABS Take 1 tablet every other day by mouth.    . Multiple Vitamins-Minerals (ZINC PO) Take 1 capsule by mouth daily.    . Omega-3 Fatty Acids (OMEGA-3  FISH OIL PO) Take 1 capsule daily by mouth.    Marland Kitchen OVER THE COUNTER MEDICATION Seamoss 1 tablespoon daily, has Elderberry, Burduck root, Bladder Whack    . pyridOXINE (VITAMIN B-6) 100 MG tablet Take 100 mg by mouth daily.    . ondansetron (ZOFRAN) 4 MG tablet TAKE 1 TABLET EVERY 8 HOURS AS NEEDED FOR NAUSEA & VOMITING (Patient not taking: Reported on 04/08/2020) 20 tablet 0  . POTASSIUM CHLORIDE PO Take 1 tablet every other day by mouth. (Patient not taking: Reported on 04/08/2020)     No current facility-administered medications for this visit.     Objective:  Ht 5\' 7"  (1.702 m)   Wt 277 lb 5.4 oz (125.8 kg)   SpO2 99%   BMI 43.44 kg/m  self reported vitals Gen: NAD, resting comfortably Lungs: nonlabored, normal respiratory rate  Skin: appears dry, no obvious rash     Assessment and Plan   # Cough/fever S:day 3 of symptoms today with dry cough. Daughter had covid dec 17 day 0. She was daughters main caretaker- handwashing, lysol, wore mask. Today is day 3 of patient cough. Temp up to 100 degrees last night, up to 101.5 today. Has heard some wheezing last night (no asthma- apparently had "bronchitis" as a kid per mom though). Some chills. Coughs up some phlegm but nose not stopped up. Tingling in nose and cannot get sneeze out. Body aches day 1 but this isnt too uncommon for her  No congestion, runny nose, shortness of breath, fatigue, sore throat, headache, nausea, vomiting, diarrhea, or new loss  of taste or smell.   Not immunized against influenza or covid-19.   Last PCR test dec 11th  A/P: Patient with symptoms concerning for potential covid 19 vs. Influenza vs. Non covid viral illness Therefore:  - testing options discussed with patient 1. [facility] testing Monday and Thursday 5 30-7 30 PM 2. Potential daytime testing- she opts for this and also wants flu to be tested (negative 3. [email or web address] - recommended  patient watch closely for shortness of breath or confusion or worsening symptoms and if those occur he should contact us immediately  -recommended patient consider purchasing pulse oximeter and if levels 94% or below persistently- seek care at the hospital  -recommended self isolation until negative test  at minimum .  - for isolation if covid 19 test positive would need to be at least 10 days since first symptom AND at least 24 hours fever free without fever reducing medications AND improvement in respiratory symptoms  - would need to inform any contacts of positive if positive- she states has no contacts -Hopeful results back within 48 hours of test but may take up to a week  - work note needed: No  -monoclonal antibody infusion discussed - she would be interested in this if positive for covid  Recommended follow up: as needed for acute concerns Future Appointments  Date Time Provider Department Center  04/17/2020 12:15 PM [facility] 1 [facility] [place] [facility]    Lab/Order associations:   ICD-10-CM   1. Cough  R05.9 POCT Influenza A/B    Novel Coronavirus, NAA ([facility])    No orders of the defined types were placed in this encounter.  Return precautions advised.  Tana Conch, MD

## 2020-04-08 NOTE — Patient Instructions (Signed)
Health Maintenance Due  Topic Date Due  . Hepatitis C Screening  Never done  . MAMMOGRAM  01/17/2020    Depression screen [facility] 2/9 07/21/2019 09/29/2017 02/12/2017  Decreased Interest 0 0 0  Down, Depressed, Hopeless 0 0 0  PHQ - 2 Score 0 0 0    Recommended follow up: No follow-ups on file.

## 2020-04-10 LAB — NOVEL CORONAVIRUS, NAA: SARS-CoV-2, NAA: DETECTED — AB

## 2020-04-10 LAB — SARS-COV-2, NAA 2 DAY TAT

## 2020-04-15 ENCOUNTER — Telehealth: Payer: Self-pay

## 2020-04-15 NOTE — Telephone Encounter (Signed)
Spoke to pt was seen by Dr. Durene Cal virtually on 04/08/2020. She is positive for COVID. Pt c/o ears are muffled started yesterday. Coughing and expectorating green sputum. She is not taking any medications at present for illness other then Flonase once a day. Pt wants to know if she needs an antibiotic? Or does she need another visit.

## 2020-04-15 NOTE — Telephone Encounter (Signed)
Muffled hearing could be from fluid in the ears backing up from her sinuses.  I think the Flonase is reasonable for this.  Does she have any ear pain or fever-if she has ear pain or even low-grade temperatures such as high 99's-I would strongly consider antibiotic for potential ear infection-please update me

## 2020-04-15 NOTE — Telephone Encounter (Signed)
Patient called in saying she seen Dr.Hunter virtually for a cough, but she said her hearing is muffled, wanted to know what she should do.

## 2020-04-16 NOTE — Telephone Encounter (Signed)
Spoke with the patient and she denies having a fever. She stated that she did have some neck pain yesterday which subsided when she took otc Tylenol. She has also used Flonase. She stated that she feels much better. I advised that if her symptoms change or worsen to give our office a call. She verbalized understanding. No other questions or concerns at this time.

## 2020-04-17 ENCOUNTER — Ambulatory Visit (HOSPITAL_COMMUNITY): Payer: PRIVATE HEALTH INSURANCE

## 2020-04-18 IMAGING — MG DIGITAL DIAGNOSTIC BILAT W/ TOMO W/ CAD
6 of 10 series · 6 of 30 positions shown · non-contrast
Comparison: Previous exam(s).

CLINICAL DATA: Follow-up for probably benign left breast masses
felt to be intramammary lymph nodes.

EXAM:
DIGITAL DIAGNOSTIC BILATERAL MAMMOGRAM WITH CAD AND TOMO

[R CV synth-2D]
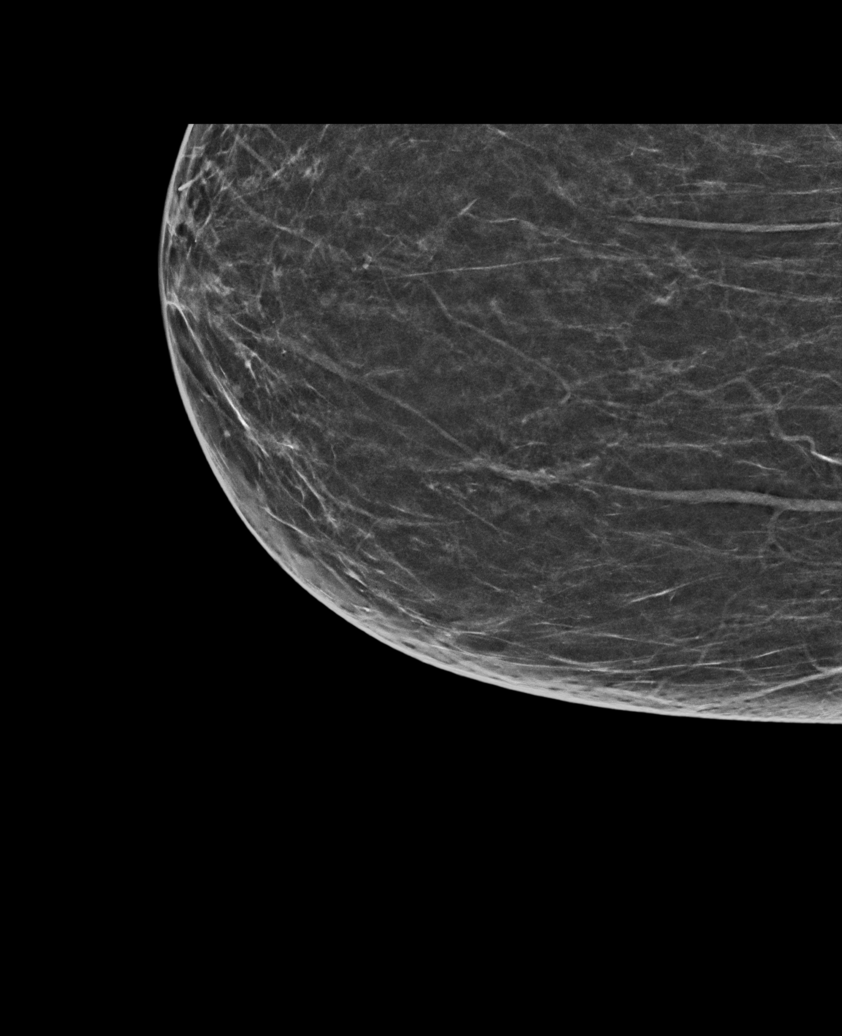

[L MLO synth-2D]
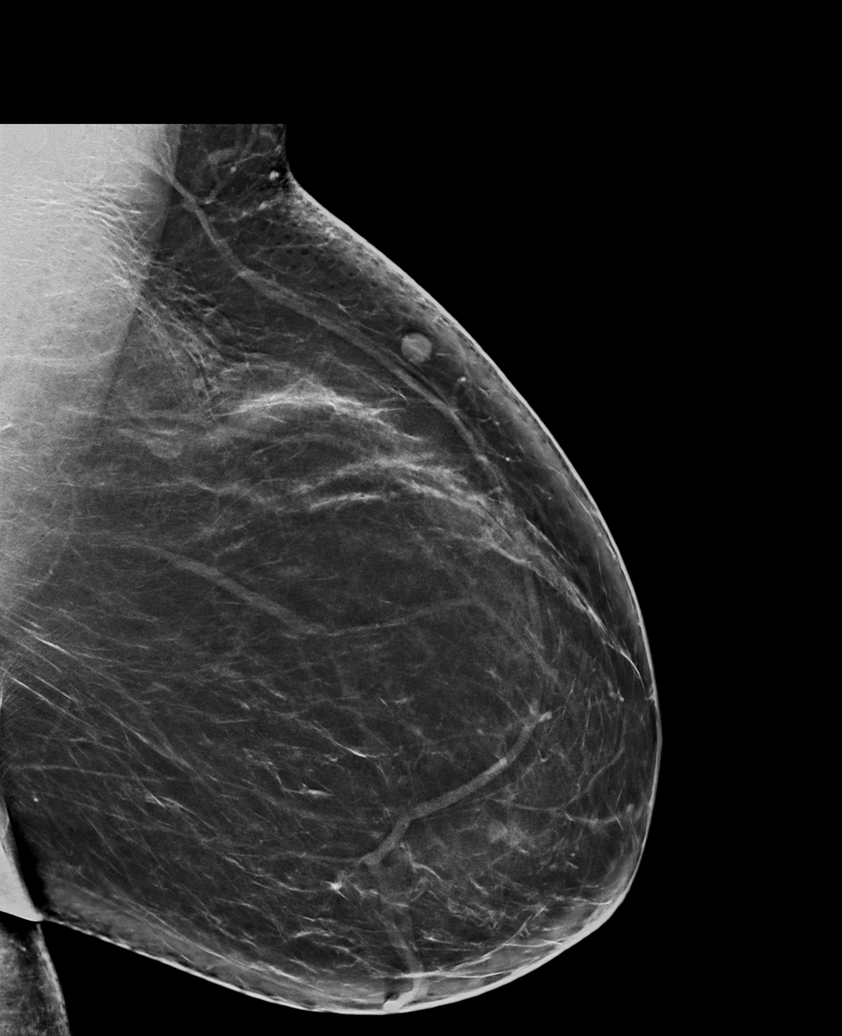

[L CC synth-2D]
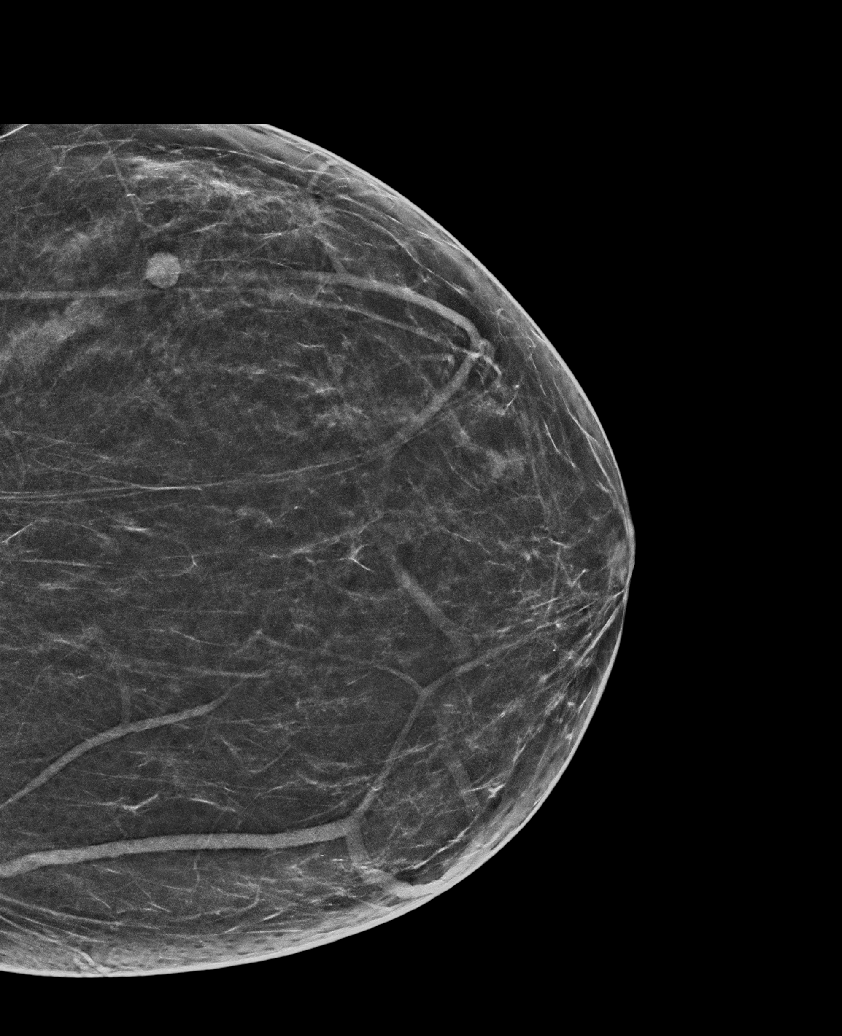

[R CC synth-2D]
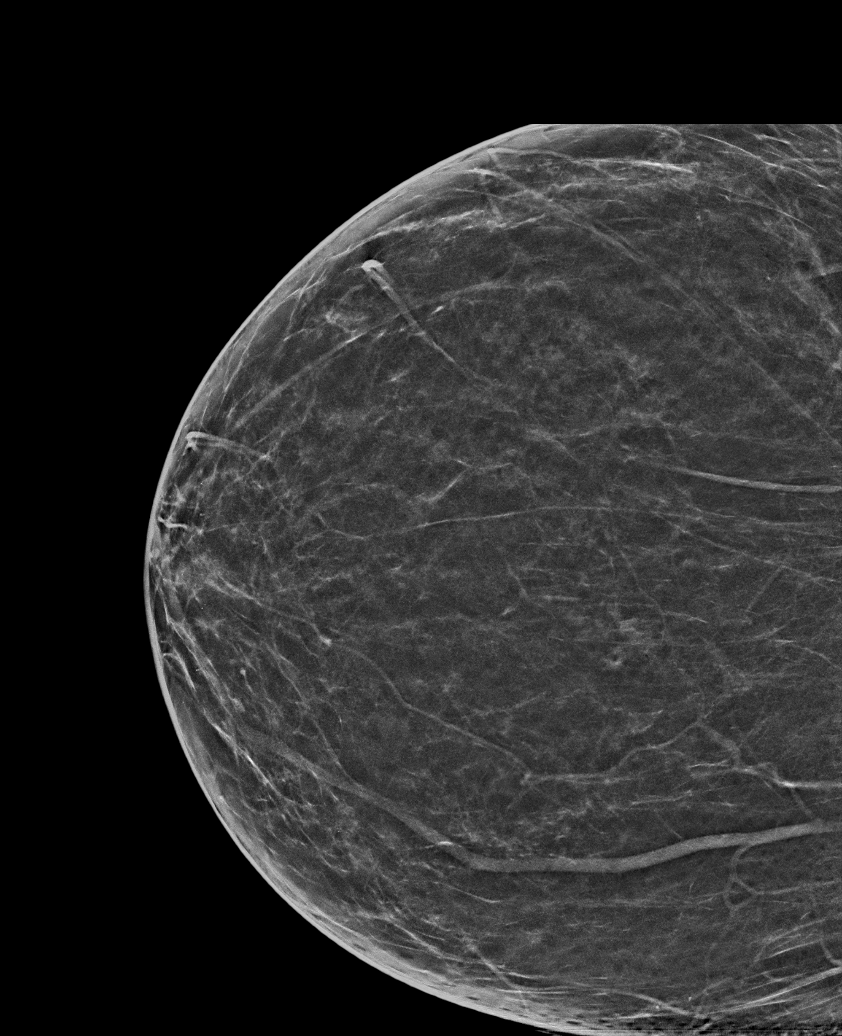

[R MLO synth-2D]
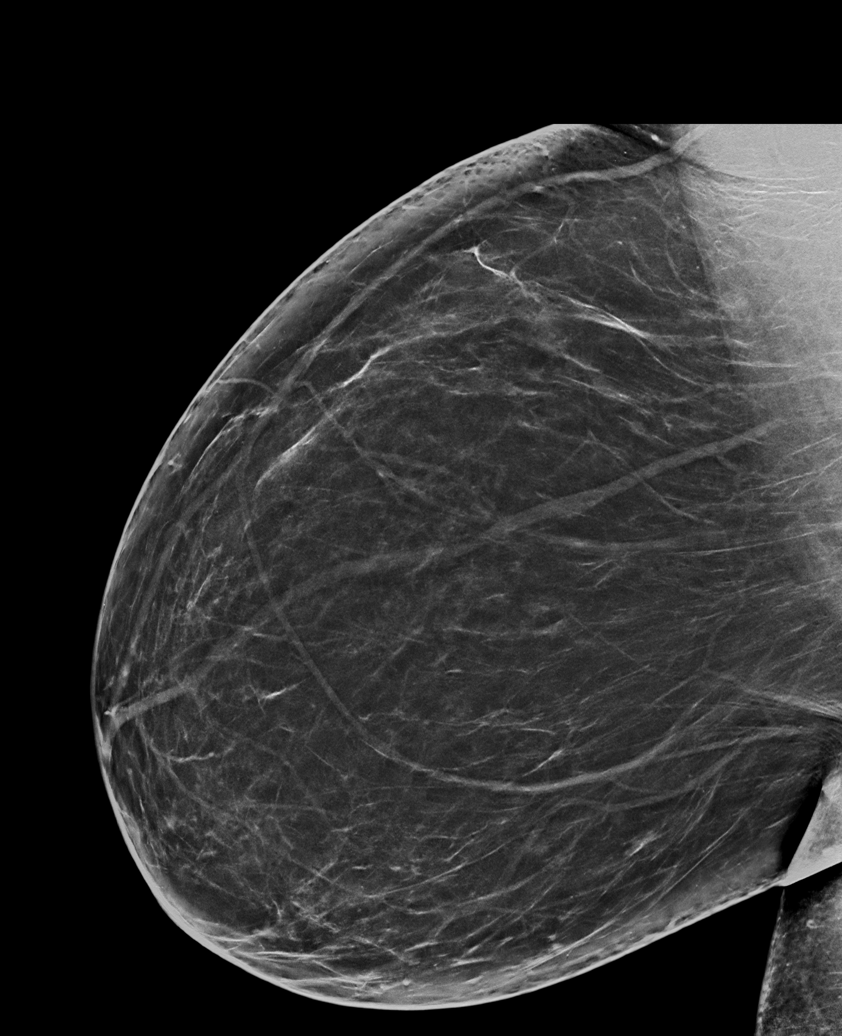

[R CV tomo · tomo slice 33/64.0]
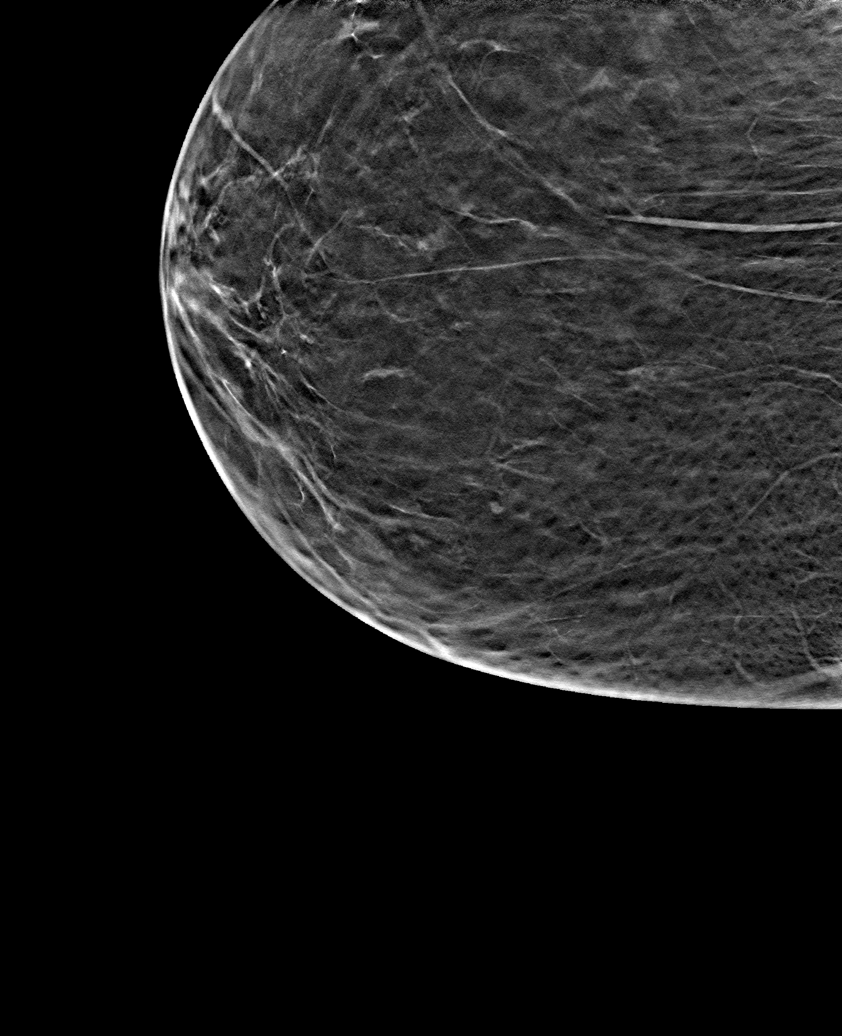

[6 of 30 positions shown; findings below may reference images not displayed]

ACR Breast Density Category b: There are scattered areas of
fibroglandular density.
FINDINGS: No suspicious masses or calcifications are seen in either breast.
The oval circumscribed mass in the upper-outer posterior left breast
as well as smaller oval circumscribed mass in the
periareolar/slightly outer left breast appear unchanged dating back
to August 2016. These are considered benign given greater than 2 years
of stability.

Mammographic images were processed with CAD.
IMPRESSION: No findings of malignancy in either breast.

RECOMMENDATION:
Screening mammogram in one year.(Code:[id number])

I have discussed the findings and recommendations with the patient.
If applicable, a reminder letter will be sent to the patient
regarding the next appointment.

BI-RADS CATEGORY  2: Benign.

## 2020-04-19 MED ORDER — DOXYCYCLINE HYCLATE 100 MG PO TABS: 100.0000 mg | ORAL_TABLET | Freq: Two times a day (BID) | ORAL | 0 refills | Status: DC

## 2020-04-19 NOTE — Telephone Encounter (Signed)
Patient is aware of Dr. Pamala Hurry comments and gave a verbal understanding.

## 2020-04-19 NOTE — Telephone Encounter (Signed)
See below

## 2020-04-19 NOTE — Telephone Encounter (Signed)
Patient calling back in stating that her neck pain and ear pain is getting worse and wants to know what she should now. Patient would like a call back.

## 2020-04-19 NOTE — Telephone Encounter (Signed)
I sent in doxycycline to cover for potential bacterial infection/otitis media.  This may not account for her neck pain.  If she has ongoing issues in the next week will need follow-up in person in the office with PCP- she is technically out of isolation period already but I think its reasonable to try this and then follow up next week if not improving. Also have Saturday virtual clinic if needed

## 2020-04-19 NOTE — Addendum Note (Signed)
Addended by: Shelva Majestic on: 04/19/2020 01:24 PM   Modules accepted: Orders

## 2020-04-24 ENCOUNTER — Ambulatory Visit (HOSPITAL_COMMUNITY)
Admission: RE | Admit: 2020-04-24 | Discharge: 2020-04-24 | Disposition: A | Discharge status: Home / Self Care | Payer: PRIVATE HEALTH INSURANCE | Source: Ambulatory Visit | Attending: *Deleted | Admitting: *Deleted

## 2020-04-24 ENCOUNTER — Other Ambulatory Visit: Payer: Self-pay

## 2020-04-24 DIAGNOSIS — Z1231 Encounter for screening mammogram for malignant neoplasm of breast: Secondary | ICD-10-CM

## 2020-05-01 ENCOUNTER — Ambulatory Visit (HOSPITAL_COMMUNITY): Payer: Self-pay

## 2020-09-12 ENCOUNTER — Other Ambulatory Visit: Payer: Self-pay | Admitting: Advanced Practice Midwife

## 2020-10-17 ENCOUNTER — Other Ambulatory Visit: Payer: Self-pay | Admitting: Advanced Practice Midwife

## 2020-11-28 ENCOUNTER — Other Ambulatory Visit: Payer: Self-pay | Admitting: Advanced Practice Midwife

## 2021-11-05 ENCOUNTER — Encounter: Payer: Self-pay | Admitting: Physician Assistant

## 2021-11-12 NOTE — Progress Notes (Signed)
Subjective:    Crystal Salinas is a 41 y.o. female and is here for a comprehensive physical exam.  HPI  Health Maintenance Due  Topic Date Due   Hepatitis C Screening  Never done   PAP SMEAR-Modifier  08/26/2020   MAMMOGRAM  04/24/2021   INFLUENZA VACCINE  11/04/2021   Acute Concerns: None   Chronic Issues: Obesity   Patient has been dealing with this for a while. States she is currently working on diet and exercise. She has been working out daily and follows intermittent fasting for the past year. Has been eating lots of vegetables and cut down on carb and sugar. Has had noticed weight loss with this. She is managing well. She denies any other concerns.  Health Maintenance: Immunizations -- UTD Colonoscopy -- N/A- Due in 2 years Mammogram -- Last done 04/2020. Will schedule appointment for next mammogram.  PAP -- UTD Bone Density -- N/A Diet -- currently doing intermitting fasting- Lots of vegetables.  Sleep habits -- No concern  Exercise -- Working out  Weight -- 268 Ib (121.9 kg) Mood -- Stable  Weight history: Wt Readings from Last 10 Encounters:  11/13/21 268 lb 12.8 oz (121.9 kg)  04/08/20 277 lb 5.4 oz (125.8 kg)  11/03/19 (!) 277 lb 6.1 oz (125.8 kg)  07/21/19 270 lb (122.5 kg)  01/20/19 270 lb 3.2 oz (122.6 kg)  08/03/18 284 lb (128.8 kg)  05/10/18 284 lb 6.1 oz (129 kg)  01/12/18 275 lb 6.1 oz (124.9 kg)  12/13/17 278 lb 8 oz (126.3 kg)  09/29/17 278 lb (126.1 kg)   Body mass index is 43.72 kg/m. Patient's last menstrual period was 11/06/2021. Alcohol use:  reports no history of alcohol use. Tobacco use: None  Tobacco Use: Low Risk  (11/13/2021)   Patient History    Smoking Tobacco Use: Never    Smokeless Tobacco Use: Never    Passive Exposure: Not on file        11/13/2021    8:24 AM  Depression screen PHQ 2/9  Decreased Interest 0  Down, Depressed, Hopeless 0  PHQ - 2 Score 0     Other providers/specialists: Patient Care  Team: Jarold Motto, [place] as PCP - General (Physician Assistant)    PMHx, SurgHx, SocialHx, Medications, and Allergies were reviewed in the Visit Navigator and updated as appropriate.   Past Medical History:  Diagnosis Date   Anxiety    Breast mass    left breast mass     Past Surgical History:  Procedure Laterality Date   tubes in ears Left 1990     Family History  Problem Relation Age of Onset   Multiple sclerosis Father    Diabetes Maternal Grandmother    Emphysema Maternal Grandmother    Stroke Paternal Grandmother    Hypertension Paternal Grandmother    Leukemia Maternal Aunt        CLL    Social History   Tobacco Use   Smoking status: Never   Smokeless tobacco: Never  Vaping Use   Vaping Use: Never used  Substance Use Topics   Alcohol use: No   Drug use: No    Review of Systems:   Review of Systems  Constitutional:  Negative for chills, fever, malaise/fatigue and weight loss.  HENT:  Negative for hearing loss, sinus pain and sore throat.   Respiratory:  Negative for cough and hemoptysis.   Cardiovascular:  Negative for chest pain, palpitations, leg swelling and PND.  Gastrointestinal:  Negative for abdominal pain, constipation, diarrhea, heartburn, nausea and vomiting.  Genitourinary:  Negative for dysuria, frequency and urgency.  Musculoskeletal:  Negative for back pain, myalgias and neck pain.  Skin:  Negative for itching and rash.  Neurological:  Negative for dizziness, tingling, seizures and headaches.  Endo/Heme/Allergies:  Negative for polydipsia.  Psychiatric/Behavioral:  Negative for depression. The patient is not nervous/anxious.      Objective:   BP 113/74   Pulse 82   Temp 98.2 F (36.8 C) (Temporal)   Ht 5' 5.75" (1.67 m)   Wt 268 lb 12.8 oz (121.9 kg)   LMP 11/06/2021   SpO2 98%   BMI 43.72 kg/m  Body mass index is 43.72 kg/m.   General Appearance:    Alert, cooperative, no distress, appears stated age  Head:     Normocephalic, without obvious abnormality, atraumatic  Eyes:    PERRL, conjunctiva/corneas clear, EOM's intact, fundi    benign, both eyes  Ears:    Normal TM's and external ear canals, both ears  Nose:   Nares normal, septum midline, mucosa normal, no drainage    or sinus tenderness  Throat:   Lips, mucosa, and tongue normal; teeth and gums normal  Neck:   Supple, symmetrical, trachea midline, no adenopathy;    thyroid:  no enlargement/tenderness/nodules; no carotid   bruit or JVD  Back:     Symmetric, no curvature, ROM normal, no CVA tenderness  Lungs:     Clear to auscultation bilaterally, respirations unlabored  Chest Wall:    No tenderness or deformity   Heart:    Regular rate and rhythm, S1 and S2 normal, no murmur, rub or gallop  Breast Exam:    Deferred  Abdomen:     Soft, non-tender, bowel sounds active all four quadrants,    no masses, no organomegaly  Genitalia:    Deferred  Extremities:   Extremities normal, atraumatic, no cyanosis or edema  Pulses:   2+ and symmetric all extremities  Skin:   Skin color, texture, turgor normal, no rashes or lesions  Lymph nodes:   Cervical, supraclavicular, and axillary nodes normal  Neurologic:   CNII-XII intact, normal strength, sensation and reflexes    throughout    Assessment/Plan:   Routine physical examination Today patient counseled on age appropriate routine health concerns for screening and prevention, each reviewed and up to date or declined. Immunizations reviewed and up to date or declined. Labs ordered and reviewed. Risk factors for depression reviewed and negative. Hearing function and visual acuity are intact. ADLs screened and addressed as needed. Functional ability and level of safety reviewed and appropriate. Education, counseling and referrals performed based on assessed risks today. Patient provided with a copy of personalized plan for preventive services.  Obesity, unspecified classification, unspecified obesity type,  unspecified whether serious comorbidity present Continue efforts at healthy lifestyle  Patient Counseling: [x]    Nutrition: Stressed importance of moderation in sodium/caffeine intake, saturated fat and cholesterol, caloric balance, sufficient intake of fresh fruits, vegetables, fiber, calcium, iron, and 1 mg of folate supplement per day (for females capable of pregnancy).  [x]    Stressed the importance of regular exercise.   [x]    Substance Abuse: Discussed cessation/primary prevention of tobacco, alcohol, or other drug use; driving or other dangerous activities under the influence; availability of treatment for abuse.   [x]    Injury prevention: Discussed safety belts, safety helmets, smoke detector, smoking near bedding or upholstery.   [x]    Sexuality: Discussed sexually  transmitted diseases, partner selection, use of condoms, avoidance of unintended pregnancy  and contraceptive alternatives.  [x]    Dental health: Discussed importance of regular tooth brushing, flossing, and dental visits.  [x]    Health maintenance and immunizations reviewed. Please refer to Health maintenance section.    I,Savera Zaman,acting as a for , PA.,have documented all relevant documentation on the behalf of Neurosurgeon, PA,as directed by  [employer], PA while in the presence of Jarold Motto, Jarold Motto.   I, Jarold Motto, [place], have reviewed all documentation for this visit. The documentation on 11/13/21 for the exam, diagnosis, procedures, and orders are all accurate and complete.  [place], PA-C [facility]

## 2021-11-13 ENCOUNTER — Ambulatory Visit (INDEPENDENT_AMBULATORY_CARE_PROVIDER_SITE_OTHER): Payer: 59 | Admitting: Physician Assistant

## 2021-11-13 ENCOUNTER — Encounter: Payer: Self-pay | Admitting: Physician Assistant

## 2021-11-13 ENCOUNTER — Other Ambulatory Visit: Payer: Self-pay | Admitting: Physician Assistant

## 2021-11-13 ENCOUNTER — Other Ambulatory Visit (HOSPITAL_COMMUNITY): Payer: Self-pay | Admitting: Physician Assistant

## 2021-11-13 ENCOUNTER — Other Ambulatory Visit: Payer: 59

## 2021-11-13 VITALS — BP 113/74 | HR 82 | Temp 98.2°F | Ht 65.75 in | Wt 268.8 lb

## 2021-11-13 DIAGNOSIS — Z1231 Encounter for screening mammogram for malignant neoplasm of breast: Secondary | ICD-10-CM

## 2021-11-13 DIAGNOSIS — E669 Obesity, unspecified: Secondary | ICD-10-CM

## 2021-11-13 DIAGNOSIS — E559 Vitamin D deficiency, unspecified: Secondary | ICD-10-CM

## 2021-11-13 DIAGNOSIS — Z0000 Encounter for general adult medical examination without abnormal findings: Secondary | ICD-10-CM

## 2021-11-13 LAB — CBC WITH DIFFERENTIAL/PLATELET
Basophils Absolute: 0 10*3/uL (ref 0.0–0.1)
Basophils Relative: 0.6 % (ref 0.0–3.0)
Eosinophils Absolute: 0 10*3/uL (ref 0.0–0.7)
Eosinophils Relative: 0.7 % (ref 0.0–5.0)
HCT: 37.8 % (ref 36.0–46.0)
Hemoglobin: 12.1 g/dL (ref 12.0–15.0)
Lymphocytes Relative: 37.6 % (ref 12.0–46.0)
Lymphs Abs: 1.7 10*3/uL (ref 0.7–4.0)
MCHC: 32 g/dL (ref 30.0–36.0)
MCV: 77 fl — ABNORMAL LOW (ref 78.0–100.0)
Monocytes Absolute: 0.4 10*3/uL (ref 0.1–1.0)
Monocytes Relative: 8.7 % (ref 3.0–12.0)
Neutro Abs: 2.3 10*3/uL (ref 1.4–7.7)
Neutrophils Relative %: 52.4 % (ref 43.0–77.0)
Platelets: 412 10*3/uL — ABNORMAL HIGH (ref 150.0–400.0)
RBC: 4.9 Mil/uL (ref 3.87–5.11)
RDW: 16.1 % — ABNORMAL HIGH (ref 11.5–15.5)
WBC: 4.4 10*3/uL (ref 4.0–10.5)

## 2021-11-13 LAB — COMPREHENSIVE METABOLIC PANEL
ALT: 15 U/L (ref 0–35)
AST: 18 U/L (ref 0–37)
Albumin: 4.2 g/dL (ref 3.5–5.2)
Alkaline Phosphatase: 81 U/L (ref 39–117)
BUN: 10 mg/dL (ref 6–23)
CO2: 29 mEq/L (ref 19–32)
Calcium: 10.1 mg/dL (ref 8.4–10.5)
Chloride: 101 mEq/L (ref 96–112)
Creatinine, Ser: 0.98 mg/dL (ref 0.40–1.20)
GFR: 70.92 mL/min (ref >60.00)
Glucose, Bld: 103 mg/dL — ABNORMAL HIGH (ref 70–99)
Potassium: 5.1 mEq/L (ref 3.5–5.1)
Sodium: 142 mEq/L (ref 135–145)
Total Bilirubin: 0.4 mg/dL (ref 0.2–1.2)
Total Protein: 7.4 g/dL (ref 6.0–8.3)

## 2021-11-13 LAB — LIPID PANEL
Cholesterol: 193 mg/dL (ref 0–200)
HDL: 41.9 mg/dL (ref >39.00)
LDL Cholesterol: 133 mg/dL — ABNORMAL HIGH (ref 0–99)
NonHDL: 151.39
Total CHOL/HDL Ratio: 5
Triglycerides: 90 mg/dL (ref 0.0–149.0)
VLDL: 18 mg/dL (ref 0.0–40.0)

## 2021-11-13 LAB — HEMOGLOBIN A1C: Hgb A1c MFr Bld: 6.1 % (ref 4.6–6.5)

## 2021-11-13 LAB — VITAMIN D 25 HYDROXY (VIT D DEFICIENCY, FRACTURES): VITD: 19.79 ng/mL — ABNORMAL LOW (ref 30.00–100.00)

## 2021-11-13 MED ORDER — VITAMIN D (ERGOCALCIFEROL) 1.25 MG (50000 UNIT) PO CAPS: 50000.0000 [IU] | ORAL_CAPSULE | ORAL | 0 refills | Status: DC

## 2021-11-13 NOTE — Addendum Note (Signed)
Addended by: Haynes Bast on: 11/13/2021 09:20 AM   Modules accepted: Orders

## 2021-11-13 NOTE — Patient Instructions (Signed)
It was great to see you!  Please schedule your mammogram, dental visit and eye exam.  Let's follow-up in 1 year, sooner if you have concerns.  If a referral was placed today --> you will be contacted for an appointment. Please note that routine referrals can sometimes take up to 3-4 weeks to process. Please call our office if you haven't heard anything after this time frame.  If blood work, urine studies, or any imaging was ordered today -->  we will release your results to you on your MyChart account (if you have chosen to sign up for this) with further instructions. You may see the results before I do, but when I review them I will send you a message with my report or have my staff call you if things need to be discussed. Please reply to my message with any questions.   Take care,  Jarold Motto PA-C

## 2021-11-19 ENCOUNTER — Ambulatory Visit (HOSPITAL_COMMUNITY)
Admission: RE | Admit: 2021-11-19 | Discharge: 2021-11-19 | Disposition: A | Discharge status: Home / Self Care | Payer: 59 | Source: Ambulatory Visit | Attending: Physician Assistant | Admitting: Physician Assistant

## 2021-11-19 DIAGNOSIS — Z1231 Encounter for screening mammogram for malignant neoplasm of breast: Secondary | ICD-10-CM | POA: Diagnosis present

## 2021-11-27 ENCOUNTER — Other Ambulatory Visit (HOSPITAL_COMMUNITY)
Admission: RE | Admit: 2021-11-27 | Discharge: 2021-11-27 | Disposition: A | Discharge status: Home / Self Care | Payer: 59 | Source: Ambulatory Visit | Attending: Advanced Practice Midwife | Admitting: Advanced Practice Midwife

## 2021-11-27 ENCOUNTER — Ambulatory Visit (INDEPENDENT_AMBULATORY_CARE_PROVIDER_SITE_OTHER): Payer: 59 | Admitting: Advanced Practice Midwife

## 2021-11-27 ENCOUNTER — Encounter: Payer: Self-pay | Admitting: Advanced Practice Midwife

## 2021-11-27 VITALS — BP 118/71 | HR 75 | Ht 67.0 in | Wt 272.0 lb

## 2021-11-27 DIAGNOSIS — R102 Pelvic and perineal pain: Secondary | ICD-10-CM | POA: Diagnosis not present

## 2021-11-27 DIAGNOSIS — G8929 Other chronic pain: Secondary | ICD-10-CM

## 2021-11-27 DIAGNOSIS — Z803 Family history of malignant neoplasm of breast: Secondary | ICD-10-CM

## 2021-11-27 DIAGNOSIS — Z01419 Encounter for gynecological examination (general) (routine) without abnormal findings: Secondary | ICD-10-CM

## 2021-11-27 NOTE — Progress Notes (Signed)
Crystal Salinas 41 y.o.  Vitals:   11/27/21 0845  BP: 118/71  Pulse: 75     Filed Weights   11/27/21 0845  Weight: 272 lb (123.4 kg)    Past Medical History: Past Medical History:  Diagnosis Date   Anxiety    Breast mass    left breast mass    Past Surgical History: Past Surgical History:  Procedure Laterality Date   tubes in ears Left 1990    Family History: Family History  Problem Relation Age of Onset   Multiple sclerosis Father    Diabetes Maternal Grandmother    Emphysema Maternal Grandmother    Stroke Paternal Grandmother    Hypertension Paternal Grandmother    Leukemia Maternal Aunt        CLL    Social History: Social History   Tobacco Use   Smoking status: Never   Smokeless tobacco: Never  Vaping Use   Vaping Use: Never used  Substance Use Topics   Alcohol use: No   Drug use: No    Allergies:  Allergies  Allergen Reactions   Amoxicillin Rash      Current Outpatient Medications:    Ascorbic Acid (VITAMIN C) 500 MG CAPS, Take 1 capsule by mouth daily., Disp: , Rfl:    Cholecalciferol (VITAMIN D3) 250 MCG (10000 UT) TABS, Take 1 tablet by mouth daily., Disp: , Rfl:    Magnesium 250 MG TABS, Take 1 tablet every other day by mouth., Disp: , Rfl:    Multiple Vitamins-Minerals (ZINC PO), Take 1 capsule by mouth daily., Disp: , Rfl:    Omega-3 Fatty Acids (OMEGA-3 FISH OIL PO), Take 1 capsule daily by mouth., Disp: , Rfl:    OVER THE COUNTER MEDICATION, Seamoss 1 tablespoon daily, has Elderberry, Burduck root, Bladder Whack, Disp: , Rfl:    POTASSIUM CHLORIDE PO, Take 1 tablet by mouth every other day., Disp: , Rfl:    pyridOXINE (VITAMIN B-6) 100 MG tablet, Take 100 mg by mouth daily., Disp: , Rfl:    Vitamin D, Ergocalciferol, (DRISDOL) 1.25 MG (50000 UNIT) CAPS capsule, Take 1 capsule (50,000 Units total) by mouth every 7 (seven) days., Disp: 12 capsule, Rfl: 0   ondansetron (ZOFRAN) 4 MG tablet, TAKE 1 TABLET EVERY 8 HOURS AS NEEDED FOR  NAUSEA & VOMITING (Patient not taking: Reported on 11/27/2021), Disp: 20 tablet, Rfl: 0  History of Present Illness: Here for pap and physical.  Periods are now pretty regular, only lasts about 3 days, not too bad. Started exercising about a month ago.  Family doesn't talk much about health history, a little worried about inherited cancers. Married, husband withdraws.  Not interested in any other BC. PCP just did physical w/labs.   Prior cytology:  Date Result Procedure  08/26/17 NILM w/ HRHPV negative None  07/05/13 NILM w/ HRHPV negative None             Review of Systems   Patient denies any headaches, blurred vision, shortness of breath, chest pain, abdominal pain, problems with bowel movements, urination, or intercourse. Has had off and on pelvic pain since having kids     Physical Exam: General:  Well developed, well nourished, no acute distress Skin:  Warm and dry Lungs; normal respiratory effort Breast:  deferred d/t mammogram last week, normal Cardiovascular: Regular rate and rhythm Abdomen:  Soft, non tender, no hepatosplenomegaly, no evidence of hernia Pelvic:  External genitalia is normal in appearance.  The vagina is normal in appearance.  The cervix is bulbous.  Uterus is felt to be normal size, shape, and contour.  No adnexal masses or tenderness noted.  Extremities:  No swelling or varicosities noted Psych:  No mood changes.     I/mpression: Normal GYN exam     Plan: if pap normal, q 3 years Mammograms yearly Referral to pelvic floor pt sent per request Info on        Blood test for cancers

## 2021-11-27 NOTE — Addendum Note (Signed)
Addended by: Jacklyn Shell on: 11/27/2021 03:34 PM   Modules accepted: Orders

## 2021-11-28 ENCOUNTER — Ambulatory Visit: Payer: 59 | Admitting: Physical Therapy

## 2021-12-02 LAB — CYTOLOGY - PAP
Adequacy: ABSENT
Chlamydia: NEGATIVE
Comment: NEGATIVE
Comment: NEGATIVE
Comment: NEGATIVE
Comment: NORMAL
Diagnosis: NEGATIVE
High risk HPV: NEGATIVE
Neisseria Gonorrhea: NEGATIVE
Trichomonas: NEGATIVE

## 2021-12-17 ENCOUNTER — Other Ambulatory Visit: Payer: Self-pay

## 2021-12-17 ENCOUNTER — Ambulatory Visit: Payer: 59 | Attending: Advanced Practice Midwife | Admitting: Physical Therapy

## 2021-12-17 DIAGNOSIS — R293 Abnormal posture: Secondary | ICD-10-CM | POA: Insufficient documentation

## 2021-12-17 DIAGNOSIS — M6208 Separation of muscle (nontraumatic), other site: Secondary | ICD-10-CM | POA: Diagnosis not present

## 2021-12-17 DIAGNOSIS — M6281 Muscle weakness (generalized): Secondary | ICD-10-CM | POA: Diagnosis not present

## 2021-12-17 DIAGNOSIS — M62838 Other muscle spasm: Secondary | ICD-10-CM | POA: Insufficient documentation

## 2021-12-17 DIAGNOSIS — M25551 Pain in right hip: Secondary | ICD-10-CM | POA: Diagnosis not present

## 2021-12-17 DIAGNOSIS — R279 Unspecified lack of coordination: Secondary | ICD-10-CM | POA: Diagnosis not present

## 2021-12-17 DIAGNOSIS — G8929 Other chronic pain: Secondary | ICD-10-CM | POA: Diagnosis not present

## 2021-12-17 DIAGNOSIS — R102 Pelvic and perineal pain: Secondary | ICD-10-CM | POA: Diagnosis present

## 2021-12-17 NOTE — Therapy (Signed)
OUTPATIENT PHYSICAL THERAPY FEMALE PELVIC EVALUATION   Patient Name: Crystal Salinas MRN: [id number] DOB:1980-11-15, 41 y.o., female Today's Date: 12/17/2021   PT End of Session - 12/17/21 0942     Visit Number 1    Date for PT Re-Evaluation 03/18/22    Authorization Type [employer]    PT Start Time 0804    PT Stop Time 0844    PT Time Calculation (min) 40 min    Activity Tolerance Patient tolerated treatment well;No increased pain    Behavior During Therapy [facility] for tasks assessed/performed             Past Medical History:  Diagnosis Date   Anxiety    Breast mass    left breast mass   Past Surgical History:  Procedure Laterality Date   tubes in ears Left 1990   Patient Active Problem List   Diagnosis Date Noted   Anxiety 09/29/2017   B12 deficiency 09/29/2017   PCOS (polycystic ovarian syndrome) 08/19/2009   Obesity 08/19/2009    PCP: Jarold Motto, PA  REFERRING PROVIDER: Jacklyn Shell, CNM  REFERRING DIAG: [id number] (ICD-10-CM) - Encounter for gynecological examination with Papanicolaou smear of cervix R10.2,G89.29 (ICD-10-CM) - Chronic pelvic pain in female  THERAPY DIAG:  Abnormal posture - Plan: PT plan of care cert/re-cert  Other muscle spasm - Plan: PT plan of care cert/re-cert  Unspecified lack of coordination - Plan: PT plan of care cert/re-cert  Muscle weakness (generalized) - Plan: PT plan of care cert/re-cert  Rationale for Evaluation and Treatment Rehabilitation  ONSET DATE: 12 years ago  SUBJECTIVE:                                                                                                                                                                                           SUBJECTIVE STATEMENT: Rt hip joint pain since having son ~12 years ago, worse at night when trying to lay down. Son was 9.12 lbs and reports pain since pregnancy and delivery with him. PT also has pain at bil pelvis.      PAIN:  Are you having  pain? Yes NPRS scale: 4/10 Pain location:  Rt hip, feels like deep in joint  Pain type: aching and dull Pain description: intermittent   Aggravating factors: sleeping on sides  Relieving factors: changing position   PRECAUTIONS: None  WEIGHT BEARING RESTRICTIONS No  FALLS:  Has patient fallen in last 6 months? No  LIVING ENVIRONMENT: Lives with: lives with their family   OCCUPATION: work from home, clinical performance LPN  PLOF: Independent  PATIENT GOALS to have less pain   PERTINENT HISTORY:  PCOS,  obesity, anxiety, B12 deficiency  Sexual abuse: No  BOWEL MOVEMENT Pain with bowel movement: No Type of bowel movement:Type (Bristol Stool Scale) 4, Frequency daily, and Strain No Fully empty rectum: Yes:   Leakage: No Pads: No Fiber supplement: No  URINATION Pain with urination: No Fully empty bladder: Yes:   Stream: Strong Urgency: No Frequency: every hour during the day but sometimes longer Leakage:  "sometimes if I wait too long".  Pads: No  INTERCOURSE Pain with intercourse: Initial Penetration Ability to have vaginal penetration:  Yes: does feel dryness sometimes  Climax: not painful Marinoff Scale: 0/3  PREGNANCY Vaginal deliveries 3 Tearing No C-section deliveries 0 Currently pregnant No  PROLAPSE None    OBJECTIVE:   DIAGNOSTIC FINDINGS:    COGNITION:  Overall cognitive status: Within functional limits for tasks assessed     SENSATION:  Light touch: Appears intact  Proprioception: Appears intact  MUSCLE LENGTH: Hamstrings and adductors are limited by 50% on Rt and 25% on lt   LUMBAR SPECIAL TESTS:  Single leg stance test: Negative, SI Compression/distraction test: Negative, FABER test: Negative, and Gaenslen's test: Negative                POSTURE: rounded shoulders, forward head, and anterior pelvic tilt   PELVIC ALIGNMENT: Rt anterior pelvic tilt mildly  LUMBARAROM/PROM:  A/PROM A/PROM  eval  Flexion Limited by 25%   Extension WFL  Right lateral flexion WFL  Left lateral flexion WFL  Right rotation Limited by 25%  Left rotation Limited by 25%   (Blank rows = not tested)  LOWER EXTREMITY ROM:  WFL  LOWER EXTREMITY MMT:  Rt hip abduction slightly painful but bil hip grossly 4/5, knees ankles 5/5   PALPATION:   General  TTP Rt piriformis, Rt ASIS, Rt lateral hip abductor proximal                External Perineal Exam no TTP                             Internal Pelvic Floor TTP at Rt obturator internus   Patient confirms identification and approves PT to assess internal pelvic floor and treatment Yes  PELVIC MMT:   MMT eval  Vaginal 4/5, 5s, 8s  Internal Anal Sphincter   External Anal Sphincter   Puborectalis   Diastasis Recti   (Blank rows = not tested)        TONE: WFL  PROLAPSE: Not seen in hooklying   TODAY'S TREATMENT  EVAL Examination completed, findings reviewed, pt educated on POC, HEP. Pt motivated to participate in PT and agreeable to attempt recommendations.     PATIENT EDUCATION:  Education details: [id number] Person educated: Patient Education method: Explanation, Demonstration, Tactile cues, Verbal cues, and Handouts Education comprehension: verbalized understanding and returned demonstration   HOME EXERCISE PROGRAM: [id number]  ASSESSMENT:  CLINICAL IMPRESSION: Patient is a 41 y.o. female  who was seen today for physical therapy evaluation and treatment for pain in right hip and pelvic pain. Pt reports this started at end of pregnancy and after delivery of son 12 years ago. Pt reports dull ache felt at night while sleeping on sides no more than 4/10 and does not have worsening pain with other activity of sitting, walking, standing, working out during the day. Pt found to have decreased flexibility of spine and bil hips, TTP at Rt hip flexor proximal, ASIS, piriformis, and had slight pain with resisted  hip abduction and ER. Pt consented to internal vaginal  assessment this date and found to have TTP and tightness at Rt obturator internus, pt reports this feels like where pain is when she feels it at night. Pt had slightly decreased strength, endurance and coordination but no other symptoms of pelvic floor dysfunction. Pt does endorse slight pain with intercourse with penetration and sometimes feels like she has dryness. Pt educated on HEP for hip stretching and motivated to attempt PT to lower pain levels. Pt would benefit from additional PT to further address deficits.     OBJECTIVE IMPAIRMENTS decreased coordination, decreased endurance, decreased strength, increased fascial restrictions, increased muscle spasms, impaired flexibility, improper body mechanics, postural dysfunction, obesity, and pain.   ACTIVITY LIMITATIONS sleeping  PARTICIPATION LIMITATIONS: interpersonal relationship and sleeping   PERSONAL FACTORS Time since onset of injury/illness/exacerbation and 1 comorbidity: medical history  are also affecting patient's functional outcome.   REHAB POTENTIAL: Good  CLINICAL DECISION MAKING: Stable/uncomplicated  EVALUATION COMPLEXITY: Low   GOALS: Goals reviewed with patient? Yes  SHORT TERM GOALS: Target date: 01/14/2022  Pt to be I with HEP.  Baseline: Goal status: INITIAL  2.  Pt to report no more than 3/10 pain at Rt hip for improved sleep quality.  Baseline:  Goal status: INITIAL   LONG TERM GOALS: Target date:  03/18/22    Pt to be I with advanced HEP.  Baseline:  Goal status: INITIAL  2.  Pt to report no more than 1/10 pain at Rt hip for improved ability to sleep longer without pain and improved sleep quality.  Baseline:  Goal status: INITIAL  3.  Pt to report no more than 1/10 pain with intercourse due to improved hip mobility and pelvic floor relaxation.  Baseline:  Goal status: INITIAL  4.  Pt to demonstrate improved coordination of pelvic floor and breathing mechanics and lifting mechanics to improve  pelvic stability and hip mobility for less pain at least 75% of the time.  Baseline:  Goal status: INITIAL  5.  Pt to demonstrate improved mobility in spine and Rt hip with no longer having restrictions due to tension to decrease pain.  Baseline:  Goal status: INITIAL   PLAN: PT FREQUENCY: 1x/week  PT DURATION:  8 sessions  PLANNED INTERVENTIONS: Therapeutic exercises, Therapeutic activity, Neuromuscular re-education, Patient/Family education, Self Care, Joint mobilization, Aquatic Therapy, Dry Needling, Spinal mobilization, Cryotherapy, Moist heat, Taping, Biofeedback, and Manual therapy  PLAN FOR NEXT SESSION: hip stretching and spine stretching, core and hip strengthening, internal if needed and pt consents  Otelia Sergeant, PT, DPT 09/13/239:45 AM

## 2021-12-18 ENCOUNTER — Other Ambulatory Visit: Payer: Self-pay | Admitting: Advanced Practice Midwife

## 2021-12-18 DIAGNOSIS — Z803 Family history of malignant neoplasm of breast: Secondary | ICD-10-CM

## 2021-12-19 ENCOUNTER — Telehealth: Payer: Self-pay | Admitting: Physician Assistant

## 2021-12-19 NOTE — Telephone Encounter (Signed)
Patient Name: ANGELES Salinas Gender: Female DOB: April 27, 1980 Age: 41 Y 1 M 26 D Return Phone Number: [phone] (Primary), [phone] (Secondary) Address: City/ State/ Zip: [place] [address] Client [facility] [employer] [facility] Provider Bufford Buttner, [place]- [place] Contact Type Call Who Is Calling Patient / Member / Family / Caregiver Call Type Triage / Clinical Relationship To Patient Self Return Phone Number [phone] (Primary) Chief Complaint Walking difficulty Reason for Call Symptomatic / Request for Health Information Initial Comment Caller states pt is having back pain and upper right thigh. The pain started at hip pain. States she had physical therapy yesterday and the manipulated her hip and now she is having immense pain. States she can barely walk or move and the pain is past a 10. The call came from receptionist at the front office of this location. Translation No Nurse Assessment Nurse: Stefano Gaul, RN, Dwana Curd Date/Time (Eastern Time): 12/19/2021 9:10:42 AM Confirm and document reason for call. If symptomatic, describe symptoms. ---Caller states she had physical therapy yesterday. her therapist manipulated her right hip on Wednesday. has pain in her right hip that started last night and used ice and heat. has used ibuprofen that did not help. having difficulty walking. pain is in her right flank area to her upper right thigh. Pain level 10 +. Does the patient have any new or worsening symptoms? ---Yes Will a triage be completed? ---Yes Related visit to physician within the last 2 weeks? ---No Does the PT have any chronic conditions? (i.e. diabetes, asthma, this includes High risk factors for pregnancy, etc.) ---Yes List chronic conditions. ---PCOS Is the patient pregnant or possibly pregnant? (Ask all females between the ages of 56-55) ---No Is this a behavioral health or  substance abuse call? ---No PLEASE NOTE: All timestamps contained within this report are represented as [place] Standard Time. CONFIDENTIALTY NOTICE: This fax transmission is intended only for the addressee. It contains information that is legally privileged, confidential or otherwise protected from use or disclosure. If you are not the intended recipient, you are strictly prohibited from reviewing, disclosing, copying using or disseminating any of this information or taking any action in reliance on or regarding this information. If you have received this fax in error, please notify us immediately by telephone so that we can arrange for its return to [place]. Phone: [phone], Toll-Free: [phone], Fax: [phone] Page: 2 of 2 Call Id: [id number] Guidelines Guideline Title Affirmed Question Affirmed Notes Nurse Date/Time Lamount Cohen Time) Flank Pain [1] SEVERE pain (e.g., excruciating, scale 8-10) AND [2] present > 1 hour Stefano Gaul, RN, Dwana Curd 12/19/2021 9:19:32 AM Disp. Time Lamount Cohen Time) Disposition Final User 12/19/2021 9:23:36 AM Go to ED Now Yes Stefano Gaul, RN, Dwana Curd Final Disposition 12/19/2021 9:23:36 AM Go to ED Now Yes Stefano Gaul, RN, Clerance Lav Disagree/Comply Comply Caller Understands Yes PreDisposition Call Doctor Care Advice Given Per Guideline GO TO ED NOW: * You need to be seen in the Emergency Department. ANOTHER ADULT SHOULD DRIVE: * It is better and safer if another adult drives instead of you. CARE ADVICE given per Flank Pain (Adult) guideline. Referrals GO TO FACILITY UNDECIDED

## 2021-12-19 NOTE — Telephone Encounter (Signed)
Patient states: - She completed PT yesterday where they manipulated her right hip - She began having right hip pain that moved to her back and upper part of right thigh  - Pain is higher than a 10, she can barely move/walk   Patient has been transferred to triage.

## 2021-12-19 NOTE — Telephone Encounter (Signed)
See Triage note 

## 2021-12-20 ENCOUNTER — Encounter (HOSPITAL_COMMUNITY): Payer: Self-pay | Admitting: *Deleted

## 2021-12-20 ENCOUNTER — Emergency Department (HOSPITAL_COMMUNITY)
Admission: EM | Admit: 2021-12-20 | Discharge: 2021-12-20 | Disposition: A | Discharge status: Home / Self Care | Payer: 59 | Attending: Emergency Medicine | Admitting: Emergency Medicine

## 2021-12-20 ENCOUNTER — Other Ambulatory Visit: Payer: Self-pay

## 2021-12-20 DIAGNOSIS — M25551 Pain in right hip: Secondary | ICD-10-CM | POA: Diagnosis present

## 2021-12-20 DIAGNOSIS — M25552 Pain in left hip: Secondary | ICD-10-CM | POA: Diagnosis not present

## 2021-12-20 MED ORDER — PREDNISONE 20 MG PO TABS
40.0000 mg | ORAL_TABLET | Freq: Every day | ORAL | 0 refills | Status: AC
Start: 2021-12-20 — End: 2021-12-25

## 2021-12-20 MED ORDER — CYCLOBENZAPRINE HCL 10 MG PO TABS: 10.0000 mg | ORAL_TABLET | Freq: Two times a day (BID) | ORAL | 0 refills | Status: DC | PRN

## 2021-12-20 MED ORDER — KETOROLAC TROMETHAMINE 30 MG/ML IJ SOLN
30.0000 mg | Freq: Once | INTRAMUSCULAR | Status: AC
  Administered 2021-12-20: 30 mg via INTRAMUSCULAR
  Filled 2021-12-20: qty 1

## 2021-12-20 NOTE — ED Provider Notes (Signed)
[facility] EMERGENCY DEPARTMENT Provider Note   CSN: [id number] Arrival date & time: 12/20/21  1119     History  Chief Complaint  Patient presents with   Back Pain    Crystal Salinas is a 41 y.o. female.  HPI   Patient without significant medical history presents with complaints of hip pain, patient states she has been having chronic hip pain mainly in her right hip for the last 12 years, this happened ever since she gave birth, she states that she will have intermittent flares of her hip, she states hip pain remains in her right hip and then will feel it, wraparound and going to the top of her right thigh, she denies any saddle paresthesias, no paresthesia or weakness moving down her legs, no urinary or bowel incontinency's, she states that pain is worsened with right hip movements, improves with rest, she states that she went to see a physical therapist and after the therapist tried to loosen up her muscles her pain had gotten worse.  She denies any systemic infection like fevers or chills, no history of IV drug use, she is not having any other complaints.  She denies any urinary symptoms.    Home Medications Prior to Admission medications   Medication Sig Start Date End Date Taking? Authorizing Provider  cyclobenzaprine (FLEXERIL) 10 MG tablet Take 1 tablet (10 mg total) by mouth 2 (two) times daily as needed for muscle spasms. 12/20/21  Yes Carroll Sage, PA-C  predniSONE (DELTASONE) 20 MG tablet Take 2 tablets (40 mg total) by mouth daily for 5 days. 12/20/21 12/25/21 Yes Carroll Sage, PA-C  Ascorbic Acid (VITAMIN C) 500 MG CAPS Take 1 capsule by mouth daily.    [provider]  Cholecalciferol (VITAMIN D3) 250 MCG (10000 UT) TABS Take 1 tablet by mouth daily.    [provider]  Magnesium 250 MG TABS Take 1 tablet every other day by mouth.    [provider]  Multiple Vitamins-Minerals (ZINC PO) Take 1 capsule by mouth daily.    [provider]  Omega-3 Fatty Acids (OMEGA-3 FISH OIL PO) Take 1 capsule daily by mouth.    [provider]  ondansetron (ZOFRAN) 4 MG tablet TAKE 1 TABLET EVERY 8 HOURS AS NEEDED FOR NAUSEA & VOMITING Patient not taking: Reported on 11/27/2021 08/11/17   Armbruster, Willaim Rayas, MD  OVER THE COUNTER MEDICATION Seamoss 1 tablespoon daily, has Elderberry, Burduck root, Bladder Whack    [provider]  POTASSIUM CHLORIDE PO Take 1 tablet by mouth every other day.    [provider]  pyridOXINE (VITAMIN B-6) 100 MG tablet Take 100 mg by mouth daily.    [provider]  Vitamin D, Ergocalciferol, (DRISDOL) 1.25 MG (50000 UNIT) CAPS capsule Take 1 capsule (50,000 Units total) by mouth every 7 (seven) days. 11/13/21   Jarold Motto, PA      Allergies    Amoxicillin    Review of Systems   Review of Systems  Constitutional:  Negative for chills and fever.  Respiratory:  Negative for shortness of breath.   Cardiovascular:  Negative for chest pain.  Gastrointestinal:  Negative for abdominal pain.  Musculoskeletal:        Hip pain   Neurological:  Negative for headaches.    Physical Exam Updated Vital Signs BP 134/82   Pulse 80   Temp 98.6 F (37 C) (Oral)   Resp 16   LMP 12/07/2021   SpO2 100%  Physical Exam Vitals and nursing note reviewed.  Constitutional:      General: She is not in acute distress.    Appearance: She is not ill-appearing.  HENT:     Head: Normocephalic and atraumatic.     Nose: No congestion.  Eyes:     Conjunctiva/sclera: Conjunctivae normal.  Cardiovascular:     Rate and Rhythm: Normal rate and regular rhythm.     Pulses: Normal pulses.  Pulmonary:     Effort: Pulmonary effort is normal.  Abdominal:     Palpations: Abdomen is soft.     Tenderness: There is no abdominal tenderness. There is no right CVA tenderness or left CVA tenderness.  Musculoskeletal:     Comments: Spine was palpated was nontender to palpation no  step-off or deformities noted, no overlying skin changes, patient has point tenderness noted around the musculature of the right iliac crest, is focalized, reproducible, she has 5 out of 5 strength neurovascular intact in lower extremities, 2+ dorsal pedal pulses.  She had positive straight leg raise on the right side.  She is ambulating without difficulty.  Skin:    General: Skin is warm and dry.  Neurological:     Mental Status: She is alert.  Psychiatric:        Mood and Affect: Mood normal.     ED Results / Procedures / Treatments   Labs (all labs ordered are listed, but only abnormal results are displayed) Labs Reviewed - No data to display  EKG None  Radiology No results found.  Procedures Procedures    Medications Ordered in ED Medications  ketorolac (TORADOL) 30 MG/ML injection 30 mg (has no administration in time range)    ED Course/ Medical Decision Making/ A&P                           Medical Decision Making Risk Prescription drug management.   This patient presents to the ED for concern of hip pain, this involves an extensive number of treatment options, and is a complaint that carries with it a high risk of complications and morbidity.  The differential diagnosis includes fracture, dislocation, spine equina    Additional history obtained:  Additional history obtained from N/A External records from outside source obtained and reviewed including PT notes   Co morbidities that complicate the patient evaluation  N/A  Social Determinants of Health:  N/A    Lab Tests:  I Ordered, and personally interpreted labs.  The pertinent results include: N/A   Imaging Studies ordered:  I ordered imaging studies including N/A I independently visualized and interpreted imaging which showed N/A I agree with the radiologist interpretation   Cardiac Monitoring:  The patient was maintained on a cardiac monitor.  I personally viewed and interpreted the  cardiac monitored which showed an underlying rhythm of: N/A   Medicines ordered and prescription drug management:  I ordered medication including Toradol for pain I have reviewed the patients home medicines and have made adjustments as needed  Critical Interventions:  N/A   Reevaluation:  Presents with hip pain, benign physical exam, she is agreement plan discharge at this time.  Consultations Obtained:  N/A    Test Considered:  X-ray of the right hip-this should be deferred as my suspicion for fracture dislocation is low at this time, no traumatic injury associate this pain, she is not at increased risk for pathological fractures.    Rule out I have low suspicion  for spinal fracture or spinal cord abnormality as patient denies urinary incontinency, retention, difficulty with bowel movements, denies saddle paresthesias.  Spine was palpated there is no step-off, crepitus or gross deformities felt, patient had 5/5 strength, full range of motion, neurovascular fully intact in the lower extremities. . Low suspicion for septic arthritis as patient denies IV drug use, skin exam was performed no erythematous, edema or warm joints noted.     Dispostion and problem list  After consideration of the diagnostic results and the patients response to treatment, I feel that the patent would benefit from discharge  Hip pain-likely muscular in nature, will provide with a dose of Toradol in the ED, will send her home with short course of steroids, muscle relaxers, however continue with physical therapy, and follow-up with orthopedic if symptoms or not improving.           Final Clinical Impression(s) / ED Diagnoses Final diagnoses:  Bilateral hip pain    Rx / DC Orders ED Discharge Orders          Ordered    predniSONE (DELTASONE) 20 MG tablet  Daily        12/20/21 1401    cyclobenzaprine (FLEXERIL) 10 MG tablet  2 times daily PRN        12/20/21 1401               Marcello Fennel, PA-C 12/20/21 1415    Wynona Dove A, DO 12/22/21 0900

## 2021-12-20 NOTE — ED Triage Notes (Signed)
Pt with left hip pain and seeing PT recently for her hip. Pt went to sit down on Sunday and had very hard time getting back up.  Back pain to left lower and radiates around left hip and across left upper thigh, tried stretching, ice, ibuprofen and muscle relaxer. Feels like something is pulling.

## 2021-12-20 NOTE — Discharge Instructions (Signed)
You have been seen here for hip pain, I recommend taking over-the-counter pain medications like ibuprofen and/or Tylenol every 6 as needed.  Please follow dosage and on the back of bottle.  I also recommend applying heat to the area and stretching out the muscles as this will help decrease stiffness and pain.  I have given you information on exercises please follow.  I am given you steroids please take as prescribed recommend taking this in the morning, I have given you Flexeril which is a muscle relaxer please take at nighttime as it can make you drowsy.  I recommend following up with your physical therapist, if symptoms or not improving I have given him a referral to orthopedics for further evaluation.  Come back to the emergency department if you develop chest pain, shortness of breath, severe abdominal pain, uncontrolled nausea, vomiting, diarrhea.

## 2021-12-22 ENCOUNTER — Telehealth: Payer: Self-pay | Admitting: Physical Therapy

## 2021-12-22 NOTE — Telephone Encounter (Signed)
Called patient to follow up on her hip pain. Left a message for her to call back.  Earlie Counts, PT @9 /18/2023@ 11:13 AM

## 2021-12-22 NOTE — Telephone Encounter (Signed)
Called patient back and left a message.  Earlie Counts, PT @9 /18/2023@ 5:14 PM

## 2021-12-25 ENCOUNTER — Telehealth: Payer: Self-pay | Admitting: Physical Therapy

## 2021-12-25 NOTE — Telephone Encounter (Signed)
Called patient and disussed on ways to manage her pain and got her a scheduled earlier with [facility] and placed on waiting list. Earlie Counts, PT @9 /21/2023@ 9:40 AM

## 2021-12-29 ENCOUNTER — Encounter: Payer: Self-pay | Admitting: Adult Health

## 2021-12-29 ENCOUNTER — Encounter: Payer: Self-pay | Admitting: *Deleted

## 2021-12-29 ENCOUNTER — Ambulatory Visit (INDEPENDENT_AMBULATORY_CARE_PROVIDER_SITE_OTHER): Payer: 59 | Admitting: Adult Health

## 2021-12-29 VITALS — BP 117/72 | HR 77 | Ht 67.0 in | Wt 271.0 lb

## 2021-12-29 DIAGNOSIS — M25551 Pain in right hip: Secondary | ICD-10-CM

## 2021-12-29 DIAGNOSIS — R1031 Right lower quadrant pain: Secondary | ICD-10-CM | POA: Diagnosis not present

## 2021-12-29 DIAGNOSIS — G8929 Other chronic pain: Secondary | ICD-10-CM

## 2021-12-29 NOTE — Progress Notes (Signed)
  Subjective:     Patient ID: Crystal Salinas, female   DOB: 1981-01-23, 41 y.o.   MRN: [id number]  HPI Crystal Salinas is a 41 year old black female, married, G3P3 in complaining of pain RLQ pain that radiates to right hip and into back. Has had pain in right hip area for about 12 years, after delivery. She is getting pelvic floor PT and it seems to intensify pain. Was seen in ER at [facility] 12/20/21 and was treated with Toradol, and given steroid and muscle relaxer but did not take. She had aunt that had hip and back pain and was diagnosed with cancer, in hip,spine, liver and lung and she passed and pt is teary.   Last pap was 11/27/21 negative HPV and malignancy.  PCP is Inda Coke, [place].   Review of Systems Denies any pain with sex, urination or bowel movements See HPI for positives. Reviewed past medical,surgical, social and family history. Reviewed medications and allergies.     Objective:   Physical Exam BP 117/72 (BP Location: Left Arm, Patient Position: Sitting, Cuff Size: Large)   Pulse 77   Ht 5\' 7"  (1.702 m)   Wt 271 lb (122.9 kg)   LMP 12/07/2021   BMI 42.44 kg/m     Skin warm and dry.Pelvic: external genitalia is normal in appearance no lesions, vagina: pink and moist,urethra has no lesions or masses noted, cervix:smooth and bulbous, no CMT, uterus: normal size, shape and contour, mildly tender, no masses felt, adnexa: no masses, RLQ  tenderness noted. Bladder is non tender and no masses felt. Has some point tenderness right buttock.   Upstream - 12/29/21 1431       Pregnancy Intention Screening   Does the patient want to become pregnant in the next year? No    Does the patient's partner want to become pregnant in the next year? No    Would the patient like to discuss contraceptive options today? No      Contraception Wrap Up   Current Method No Method - Other Reason    End Method No Method - Other Reason            Examination chaperoned by Levy Pupa  LPN  Assessment:     1. RLQ abdominal pain Will get pelvic US 12/31/21 at 7 am at [facility] to assess uterus and ovaries, will talk when results back - US PELVIC COMPLETE WITH TRANSVAGINAL; Future  2. Chronic right hip pain   May need to see orthopedist   Plan:     Follow up prn

## 2021-12-30 ENCOUNTER — Ambulatory Visit: Payer: 59 | Admitting: Physical Therapy

## 2021-12-30 ENCOUNTER — Ambulatory Visit: Payer: 59 | Admitting: Physician Assistant

## 2021-12-30 DIAGNOSIS — R279 Unspecified lack of coordination: Secondary | ICD-10-CM

## 2021-12-30 DIAGNOSIS — R293 Abnormal posture: Secondary | ICD-10-CM

## 2021-12-30 DIAGNOSIS — M62838 Other muscle spasm: Secondary | ICD-10-CM

## 2021-12-30 DIAGNOSIS — M6281 Muscle weakness (generalized): Secondary | ICD-10-CM

## 2021-12-30 DIAGNOSIS — R102 Pelvic and perineal pain: Secondary | ICD-10-CM | POA: Diagnosis not present

## 2021-12-30 NOTE — Therapy (Signed)
OUTPATIENT PHYSICAL THERAPY FEMALE PELVIC TREATMENT   Patient Name: Crystal Salinas MRN: [id number] DOB:May 08, 1980, 41 y.o., female Today's Date: 12/30/2021   PT End of Session - 12/30/21 0800     Visit Number 1    Date for PT Re-Evaluation 03/18/22    Authorization Type [employer]    PT Start Time 0800    PT Stop Time 0842    PT Time Calculation (min) 42 min    Activity Tolerance Patient tolerated treatment well;No increased pain    Behavior During Therapy [facility] for tasks assessed/performed             Past Medical History:  Diagnosis Date   Anxiety    Breast mass    left breast mass   Past Surgical History:  Procedure Laterality Date   tubes in ears Left 1990   Patient Active Problem List   Diagnosis Date Noted   Chronic right hip pain 12/29/2021   RLQ abdominal pain 12/29/2021   Anxiety 09/29/2017   B12 deficiency 09/29/2017   PCOS (polycystic ovarian syndrome) 08/19/2009   Obesity 08/19/2009    PCP: Inda Coke, PA  REFERRING PROVIDER: Christin Fudge, CNM  REFERRING DIAG: [phone] (ICD-10-CM) - Encounter for gynecological examination with Papanicolaou smear of cervix R10.2,G89.29 (ICD-10-CM) - Chronic pelvic pain in female  THERAPY DIAG:  Muscle weakness (generalized)  Abnormal posture  Unspecified lack of coordination  Other muscle spasm  Rationale for Evaluation and Treatment Rehabilitation  ONSET DATE: 12 years ago  SUBJECTIVE:                                                                                                                                                                                           SUBJECTIVE STATEMENT: Pt reports she did HEP after evaluation and started having some pain in Rt hip, radiated to Lt hip and did get worse on Thursday. Reports she attempted stretching again and pain did feel better briefly but then had pain return Saturday unable to tolerate standing or sitting up fully. Went to ED but did not  have imagining and received shot of Toradol. Pt reports now she is feeling better. Pain felt like "band around my back". Did also ice and rest since then.    PAIN:  Are you having pain? Yes NPRS scale: 1/10 Pain location:  Rt hip, feels like deep in joint  Pain type: aching and dull Pain description: intermittent   Aggravating factors: sleeping on sides  Relieving factors: changing position   PRECAUTIONS: None  WEIGHT BEARING RESTRICTIONS No  FALLS:  Has patient fallen in last 6 months? No  LIVING ENVIRONMENT: Lives with:  lives with their family   OCCUPATION: work from home, clinical performance LPN  PLOF: Independent  PATIENT GOALS to have less pain   PERTINENT HISTORY:  PCOS, obesity, anxiety, B12 deficiency  Sexual abuse: No  BOWEL MOVEMENT Pain with bowel movement: No Type of bowel movement:Type (Bristol Stool Scale) 4, Frequency daily, and Strain No Fully empty rectum: Yes:   Leakage: No Pads: No Fiber supplement: No  URINATION Pain with urination: No Fully empty bladder: Yes:   Stream: Strong Urgency: No Frequency: every hour during the day but sometimes longer Leakage:  "sometimes if I wait too long".  Pads: No  INTERCOURSE Pain with intercourse: Initial Penetration Ability to have vaginal penetration:  Yes: does feel dryness sometimes  Climax: not painful Marinoff Scale: 0/3  PREGNANCY Vaginal deliveries 3 Tearing No C-section deliveries 0 Currently pregnant No  PROLAPSE None    OBJECTIVE:   DIAGNOSTIC FINDINGS:    COGNITION:  Overall cognitive status: Within functional limits for tasks assessed     SENSATION:  Light touch: Appears intact  Proprioception: Appears intact  MUSCLE LENGTH: Hamstrings and adductors are limited by 50% on Rt and 25% on lt   LUMBAR SPECIAL TESTS:  Single leg stance test: Negative, SI Compression/distraction test: Negative, FABER test: Negative, and Gaenslen's test: Negative                 POSTURE: rounded shoulders, forward head, and anterior pelvic tilt   PELVIC ALIGNMENT: Rt anterior pelvic tilt mildly  LUMBARAROM/PROM:  A/PROM A/PROM  eval  Flexion Limited by 25%  Extension WFL  Right lateral flexion WFL  Left lateral flexion WFL  Right rotation Limited by 25%  Left rotation Limited by 25%   (Blank rows = not tested)  LOWER EXTREMITY ROM:  WFL  LOWER EXTREMITY MMT:  Rt hip abduction slightly painful but bil hip grossly 4/5, knees ankles 5/5   PALPATION:   General  TTP Rt piriformis, Rt ASIS, Rt lateral hip abductor proximal                External Perineal Exam no TTP                             Internal Pelvic Floor TTP at Rt obturator internus   Patient confirms identification and approves PT to assess internal pelvic floor and treatment Yes  PELVIC MMT:   MMT eval  Vaginal 4/5, 5s, 8s  Internal Anal Sphincter   External Anal Sphincter   Puborectalis   Diastasis Recti   (Blank rows = not tested)        TONE: WFL  PROLAPSE: Not seen in hooklying   TODAY'S TREATMENT  12/30/2021: Happy baby in sitting 2x30s Hooklying hip IR 2x30s each Piriformis stretch bil 2x30s TA activation 2x10  HEP updated to improve pt's comfort and tolerance to activity and educated on this during session.    PATIENT EDUCATION:  Education details: [id number] Person educated: Patient Education method: Explanation, Demonstration, Tactile cues, Verbal cues, and Handouts Education comprehension: verbalized understanding and returned demonstration   HOME EXERCISE PROGRAM: [id number]  ASSESSMENT:  CLINICAL IMPRESSION: Patient session focused on review of HEP and modifications made to improved tolerance and decreased pain with stretching at home. Pt completed all exercises from new HEP in session and denied pain but could feel stretching appropriately without pain. Pt reports she feels much better and denied additional questions about HEP. Pt would benefit from  additional  PT to further address deficits.     OBJECTIVE IMPAIRMENTS decreased coordination, decreased endurance, decreased strength, increased fascial restrictions, increased muscle spasms, impaired flexibility, improper body mechanics, postural dysfunction, obesity, and pain.   ACTIVITY LIMITATIONS sleeping  PARTICIPATION LIMITATIONS: interpersonal relationship and sleeping   PERSONAL FACTORS Time since onset of injury/illness/exacerbation and 1 comorbidity: medical history  are also affecting patient's functional outcome.   REHAB POTENTIAL: Good  CLINICAL DECISION MAKING: Stable/uncomplicated  EVALUATION COMPLEXITY: Low   GOALS: Goals reviewed with patient? Yes  SHORT TERM GOALS: Target date: 01/14/2022  Pt to be I with HEP.  Baseline: Goal status: INITIAL  2.  Pt to report no more than 3/10 pain at Rt hip for improved sleep quality.  Baseline:  Goal status: INITIAL   LONG TERM GOALS: Target date:  03/18/22    Pt to be I with advanced HEP.  Baseline:  Goal status: INITIAL  2.  Pt to report no more than 1/10 pain at Rt hip for improved ability to sleep longer without pain and improved sleep quality.  Baseline:  Goal status: INITIAL  3.  Pt to report no more than 1/10 pain with intercourse due to improved hip mobility and pelvic floor relaxation.  Baseline:  Goal status: INITIAL  4.  Pt to demonstrate improved coordination of pelvic floor and breathing mechanics and lifting mechanics to improve pelvic stability and hip mobility for less pain at least 75% of the time.  Baseline:  Goal status: INITIAL  5.  Pt to demonstrate improved mobility in spine and Rt hip with no longer having restrictions due to tension to decrease pain.  Baseline:  Goal status: INITIAL   PLAN: PT FREQUENCY: 1x/week  PT DURATION:  8 sessions  PLANNED INTERVENTIONS: Therapeutic exercises, Therapeutic activity, Neuromuscular re-education, Patient/Family education, Self Care, Joint  mobilization, Aquatic Therapy, Dry Needling, Spinal mobilization, Cryotherapy, Moist heat, Taping, Biofeedback, and Manual therapy  PLAN FOR NEXT SESSION: hip stretching and spine stretching, core and hip strengthening, internal if needed and pt consents  Otelia Sergeant, PT, DPT 09/26/239:00 AM

## 2021-12-31 ENCOUNTER — Ambulatory Visit (HOSPITAL_BASED_OUTPATIENT_CLINIC_OR_DEPARTMENT_OTHER)
Admission: RE | Admit: 2021-12-31 | Discharge: 2021-12-31 | Disposition: A | Discharge status: Home / Self Care | Payer: 59 | Source: Ambulatory Visit | Attending: Adult Health | Admitting: Adult Health

## 2021-12-31 DIAGNOSIS — R1031 Right lower quadrant pain: Secondary | ICD-10-CM | POA: Diagnosis not present

## 2022-01-01 ENCOUNTER — Encounter: Payer: Self-pay | Admitting: Physician Assistant

## 2022-01-01 ENCOUNTER — Telehealth: Payer: Self-pay | Admitting: Adult Health

## 2022-01-01 DIAGNOSIS — G8929 Other chronic pain: Secondary | ICD-10-CM

## 2022-01-01 NOTE — Telephone Encounter (Signed)
Patient calling stating that she has a few question about her ultra sound. Asking if you would give her a call [phone]

## 2022-01-01 NOTE — Telephone Encounter (Signed)
Left message I called 

## 2022-01-05 NOTE — Progress Notes (Signed)
I, Crystal Salinas, LAT, ATC acting as a scribe for Crystal Leader, MD.  Subjective:    CC: R hip pain  HPI: Pt is a 41 y/o female c/o R hip pain ongoing intermittently for the past 12 years, worsening over the last 4-5 years. Pt notes that she has been told in the past that she has tight hip flexors. This issue has been evaluated by OBGYN and PCP. Pt locates pain to the anterior aspect of the R hip that wraps around posteriorly. Pt has a hx of ovarian cysts. Pt notes that since Feb she has been working out consistently.  She has attended pelvic physical therapy for this which felt as though she was able to reproduce the pain somewhat in the pelvic physical therapy session.  Additionally she has some right toe numbness and thinks that some of her hip pain may be related to a potential pinched nerve in her back.  She denies significant leg weakness.  Low back pain: yes- slight, nagging Radiates: yes LE Numbness/tingling:yes LE Weakness: no Aggravates: nothing in particular Treatments tried: ice, heat, stretch, Toradol injection,   Dx testing: 12/31/21 Pelvic US  Pertinent review of Systems: No fevers or chills  Relevant historical information: Cystic ovarian syndrome.   Objective:    Vitals:   01/06/22 0747  BP: 116/80  Pulse: 80  SpO2: 97%   General: Well Developed, well nourished, and in no acute distress.   MSK: L-spine: Normal appearing Nontender midline. Normal lumbar motion.  Right hip normal. Normal motion.  Intact strength.  Some pain with rotation.  Lab and Radiology Results  X-ray images right hip and L-spine obtained today personally and independently interpreted.  L-spine: DDD L5-S1.  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  No acute fractures are present.  Right hip: No severe arthritis.  No acute fractures.  No aggressive appearing bony lesions.  Await formal radiology review    Impression and Recommendations:    Assessment and Plan: 41 y.o. female with  right hip and groin pain thought to be likely muscle or tendinous dysfunction.  Based on her description it sounds like there may be a hip flexor tendon or pelvic floor dysfunction which certainly could cause similar pain.  She is just getting started with pelvic physical therapy which I think is a great idea.  If pelvic physical therapy is insufficient she may have some benefit with conventional physical therapy.  Plan on giving pelvic PT a good try for 1 month and checking back in a month.  If not better would consider MRI arthrogram of the hip.  Additionally she notes some toe numbness on the right that she thinks could be potential radiculopathy.  X-ray lumbar spine largely as expected for her age.  Watchful waiting for now.Crystal Salinas  PDMP not reviewed this encounter. Orders Placed This Encounter  Procedures   DG HIP UNILAT W OR W/O PELVIS 2-3 VIEWS RIGHT    Standing Status:   Future    Number of Occurrences:   1    Standing Expiration Date:   02/06/2022    Order Specific Question:   Reason for Exam (SYMPTOM  OR DIAGNOSIS REQUIRED)    Answer:   right hip pain    Order Specific Question:   Preferred imaging location?    Answer:   [facility]    Order Specific Question:   Is patient pregnant?    Answer:   No   DG Lumbar Spine 2-3 Views    Standing Status:  Future    Number of Occurrences:   1    Standing Expiration Date:   01/07/2023    Order Specific Question:   Reason for Exam (SYMPTOM  OR DIAGNOSIS REQUIRED)    Answer:   eval lumbar rad    Order Specific Question:   Is patient pregnant?    Answer:   No    Order Specific Question:   Preferred imaging location?    Answer:   [facility]   No orders of the defined types were placed in this encounter.   Discussed warning signs or symptoms. Please see discharge instructions. Patient expresses understanding.   The above documentation has been reviewed and is accurate and complete Crystal Salinas, M.D.

## 2022-01-06 ENCOUNTER — Ambulatory Visit: Payer: Self-pay

## 2022-01-06 ENCOUNTER — Ambulatory Visit (INDEPENDENT_AMBULATORY_CARE_PROVIDER_SITE_OTHER): Payer: 59

## 2022-01-06 ENCOUNTER — Ambulatory Visit (INDEPENDENT_AMBULATORY_CARE_PROVIDER_SITE_OTHER): Payer: 59 | Admitting: Family Medicine

## 2022-01-06 VITALS — BP 116/80 | HR 80 | Ht 67.0 in | Wt 270.0 lb

## 2022-01-06 DIAGNOSIS — G8929 Other chronic pain: Secondary | ICD-10-CM

## 2022-01-06 DIAGNOSIS — M25551 Pain in right hip: Secondary | ICD-10-CM | POA: Diagnosis not present

## 2022-01-06 DIAGNOSIS — M5416 Radiculopathy, lumbar region: Secondary | ICD-10-CM | POA: Diagnosis not present

## 2022-01-06 NOTE — Patient Instructions (Signed)
Thank you for coming in today.   Please get an Xray today before you leave   Continue PT.   Recheck in 1 month.   Let me know if this is not working.

## 2022-01-08 ENCOUNTER — Ambulatory Visit: Payer: 59 | Attending: Advanced Practice Midwife | Admitting: Physical Therapy

## 2022-01-08 DIAGNOSIS — M6281 Muscle weakness (generalized): Secondary | ICD-10-CM | POA: Diagnosis not present

## 2022-01-08 DIAGNOSIS — R279 Unspecified lack of coordination: Secondary | ICD-10-CM | POA: Diagnosis present

## 2022-01-08 DIAGNOSIS — M62838 Other muscle spasm: Secondary | ICD-10-CM | POA: Diagnosis present

## 2022-01-08 NOTE — Progress Notes (Signed)
Right hip x-ray looks normal to radiology

## 2022-01-08 NOTE — Progress Notes (Signed)
Lumbar spine x-ray shows some mild arthritis changes in the low back.

## 2022-01-08 NOTE — Therapy (Signed)
OUTPATIENT PHYSICAL THERAPY FEMALE PELVIC TREATMENT   Patient Name: Crystal Salinas MRN: [id number] DOB:Mar 04, 1981, 41 y.o., female Today's Date: 01/08/2022   PT End of Session - 01/08/22 0930     Visit Number 3    Date for PT Re-Evaluation 03/18/22    Authorization Type U[employer]    PT Start Time 0930    PT Stop Time 1011    PT Time Calculation (min) 41 min    Activity Tolerance Patient tolerated treatment well;No increased pain    Behavior During Therapy [facility] for tasks assessed/performed             Past Medical History:  Diagnosis Date   Anxiety    Breast mass    left breast mass   Past Surgical History:  Procedure Laterality Date   tubes in ears Left 1990   Patient Active Problem List   Diagnosis Date Noted   Chronic right hip pain 12/29/2021   RLQ abdominal pain 12/29/2021   Anxiety 09/29/2017   B12 deficiency 09/29/2017   PCOS (polycystic ovarian syndrome) 08/19/2009   Obesity 08/19/2009    PCP: Jarold Motto, PA  REFERRING PROVIDER: Jacklyn Shell, CNM  REFERRING DIAG: [phone] (ICD-10-CM) - Encounter for gynecological examination with Papanicolaou smear of cervix R10.2,G89.29 (ICD-10-CM) - Chronic pelvic pain in female  THERAPY DIAG:  Muscle weakness (generalized)  Unspecified lack of coordination  Rationale for Evaluation and Treatment Rehabilitation  ONSET DATE: 12 years ago  SUBJECTIVE:                                                                                                                                                                                           SUBJECTIVE STATEMENT: Pt reports she was much better after last session and did not have the high pain levels like before. 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  No acute fractures are present. Right hip: No severe arthritis.  No acute fractures.  No aggressive appearing bony lesions  PAIN:  Are you  having pain? Yes NPRS scale: 1/10 Pain location:  Rt hip, feels like deep in joint  Pain type: aching and dull Pain description: intermittent   Aggravating factors: sleeping on sides  Relieving factors: changing position   PRECAUTIONS: None  WEIGHT BEARING RESTRICTIONS No  FALLS:  Has patient fallen in last 6 months? No  LIVING ENVIRONMENT: Lives with: lives with their family   OCCUPATION: work from home, clinical performance LPN  PLOF: Independent  PATIENT GOALS to have less pain   PERTINENT HISTORY:  PCOS, obesity, anxiety, B12  deficiency  Sexual abuse: No  BOWEL MOVEMENT Pain with bowel movement: No Type of bowel movement:Type (Bristol Stool Scale) 4, Frequency daily, and Strain No Fully empty rectum: Yes:   Leakage: No Pads: No Fiber supplement: No  URINATION Pain with urination: No Fully empty bladder: Yes:   Stream: Strong Urgency: No Frequency: every hour during the day but sometimes longer Leakage:  "sometimes if I wait too long".  Pads: No  INTERCOURSE Pain with intercourse: Initial Penetration Ability to have vaginal penetration:  Yes: does feel dryness sometimes  Climax: not painful Marinoff Scale: 0/3  PREGNANCY Vaginal deliveries 3 Tearing No C-section deliveries 0 Currently pregnant No  PROLAPSE None    OBJECTIVE:   DIAGNOSTIC FINDINGS:    COGNITION:  Overall cognitive status: Within functional limits for tasks assessed     SENSATION:  Light touch: Appears intact  Proprioception: Appears intact  MUSCLE LENGTH: Hamstrings and adductors are limited by 50% on Rt and 25% on lt   LUMBAR SPECIAL TESTS:  Single leg stance test: Negative, SI Compression/distraction test: Negative, FABER test: Negative, and Gaenslen's test: Negative                POSTURE: rounded shoulders, forward head, and anterior pelvic tilt   PELVIC ALIGNMENT: Rt anterior pelvic tilt mildly  LUMBARAROM/PROM:  A/PROM A/PROM  eval  Flexion Limited by  25%  Extension WFL  Right lateral flexion WFL  Left lateral flexion WFL  Right rotation Limited by 25%  Left rotation Limited by 25%   (Blank rows = not tested)  LOWER EXTREMITY ROM:  WFL  LOWER EXTREMITY MMT:  Rt hip abduction slightly painful but bil hip grossly 4/5, knees ankles 5/5   PALPATION:   General  TTP Rt piriformis, Rt ASIS, Rt lateral hip abductor proximal                External Perineal Exam no TTP                             Internal Pelvic Floor TTP at Rt obturator internus   Patient confirms identification and approves PT to assess internal pelvic floor and treatment Yes  PELVIC MMT:   MMT eval  Vaginal 4/5, 5s, 8s  Internal Anal Sphincter   External Anal Sphincter   Puborectalis   Diastasis Recti   (Blank rows = not tested)        TONE: WFL  PROLAPSE: Not seen in hooklying   TODAY'S TREATMENT   01/08/2022: Happy baby single leg 2x30s each Hooklying hip IR 2x30s each Piriformis 2x30s each Hip flexor in supine 2x30s Reverse clam 2x10 bil  Quad TA x20 with breathing mechanics X10 quad (x5 each shoulder flexion with TA activation and breathing mechanics)  12/30/2021: Happy baby in sitting 2x30s Hooklying hip IR 2x30s each Piriformis stretch bil 2x30s TA activation 2x10  HEP updated to improve pt's comfort and tolerance to activity and educated on this during session.    PATIENT EDUCATION:  Education details: [id number] Person educated: Patient Education method: Explanation, Demonstration, Tactile cues, Verbal cues, and Handouts Education comprehension: verbalized understanding and returned demonstration   HOME EXERCISE PROGRAM: [email or web address]  ASSESSMENT:  CLINICAL IMPRESSION: Patient session focused on hip and spine stretching for improved mobility and decreased restrictions for decreased pain. Session also initiated more core strengthening today. Pt tolerated well benefited from cues for technique. Pt would benefit from additional PT to  further address deficits.  OBJECTIVE IMPAIRMENTS decreased coordination, decreased endurance, decreased strength, increased fascial restrictions, increased muscle spasms, impaired flexibility, improper body mechanics, postural dysfunction, obesity, and pain.   ACTIVITY LIMITATIONS sleeping  PARTICIPATION LIMITATIONS: interpersonal relationship and sleeping   PERSONAL FACTORS Time since onset of injury/illness/exacerbation and 1 comorbidity: medical history  are also affecting patient's functional outcome.   REHAB POTENTIAL: Good  CLINICAL DECISION MAKING: Stable/uncomplicated  EVALUATION COMPLEXITY: Low   GOALS: Goals reviewed with patient? Yes  SHORT TERM GOALS: Target date: 01/14/2022  Pt to be I with HEP.  Baseline: Goal status: INITIAL  2.  Pt to report no more than 3/10 pain at Rt hip for improved sleep quality.  Baseline:  Goal status: INITIAL   LONG TERM GOALS: Target date:  03/18/22    Pt to be I with advanced HEP.  Baseline:  Goal status: INITIAL  2.  Pt to report no more than 1/10 pain at Rt hip for improved ability to sleep longer without pain and improved sleep quality.  Baseline:  Goal status: INITIAL  3.  Pt to report no more than 1/10 pain with intercourse due to improved hip mobility and pelvic floor relaxation.  Baseline:  Goal status: INITIAL  4.  Pt to demonstrate improved coordination of pelvic floor and breathing mechanics and lifting mechanics to improve pelvic stability and hip mobility for less pain at least 75% of the time.  Baseline:  Goal status: INITIAL  5.  Pt to demonstrate improved mobility in spine and Rt hip with no longer having restrictions due to tension to decrease pain.  Baseline:  Goal status: INITIAL   PLAN: PT FREQUENCY: 1x/week  PT DURATION:  8 sessions  PLANNED INTERVENTIONS: Therapeutic exercises, Therapeutic activity, Neuromuscular re-education, Patient/Family education, Self Care, Joint mobilization, Aquatic  Therapy, Dry Needling, Spinal mobilization, Cryotherapy, Moist heat, Taping, Biofeedback, and Manual therapy  PLAN FOR NEXT SESSION: hip stretching and spine stretching, core and hip strengthening, internal if needed and pt consents; progress core strengthening  Stacy Gardner, PT, DPT 01/08/2309:12 AM

## 2022-01-21 ENCOUNTER — Ambulatory Visit: Payer: 59 | Admitting: Physical Therapy

## 2022-01-21 DIAGNOSIS — M62838 Other muscle spasm: Secondary | ICD-10-CM

## 2022-01-21 DIAGNOSIS — R279 Unspecified lack of coordination: Secondary | ICD-10-CM

## 2022-01-21 DIAGNOSIS — M6281 Muscle weakness (generalized): Secondary | ICD-10-CM

## 2022-01-21 NOTE — Therapy (Signed)
OUTPATIENT PHYSICAL THERAPY FEMALE PELVIC TREATMENT   Patient Name: Crystal Salinas MRN: [id number] DOB:1980/06/09, 41 y.o., female Today's Date: 01/21/2022   PT End of Session - 01/21/22 0801     Visit Number 4    Date for PT Re-Evaluation 03/18/22    Authorization Type [employer]    PT Start Time 0800    PT Stop Time 0840    PT Time Calculation (min) 40 min    Activity Tolerance Patient tolerated treatment well;No increased pain    Behavior During Therapy [facility] for tasks assessed/performed             Past Medical History:  Diagnosis Date   Anxiety    Breast mass    left breast mass   Past Surgical History:  Procedure Laterality Date   tubes in ears Left 1990   Patient Active Problem List   Diagnosis Date Noted   Chronic right hip pain 12/29/2021   RLQ abdominal pain 12/29/2021   Anxiety 09/29/2017   B12 deficiency 09/29/2017   PCOS (polycystic ovarian syndrome) 08/19/2009   Obesity 08/19/2009    PCP: Inda Coke, PA  REFERRING PROVIDER: Christin Fudge, CNM  REFERRING DIAG: [phone] (ICD-10-CM) - Encounter for gynecological examination with Papanicolaou smear of cervix R10.2,G89.29 (ICD-10-CM) - Chronic pelvic pain in female  THERAPY DIAG:  Unspecified lack of coordination  Muscle weakness (generalized)  Other muscle spasm  Rationale for Evaluation and Treatment Rehabilitation  ONSET DATE: 12 years ago  SUBJECTIVE:                                                                                                                                                                                           SUBJECTIVE STATEMENT: Pt reports pain "has been decent". Pt states her goal has been 20 mins of walking per day, this morning had tenderness in right glute. Has not had pain at night which she was pleased with.     PAIN:  Are you having pain? Yes NPRS scale: 1/10 Pain location:  Rt hip, feels like deep in joint  Pain type: aching and  dull Pain description: intermittent   Aggravating factors: sleeping on sides  Relieving factors: changing position   PRECAUTIONS: None  WEIGHT BEARING RESTRICTIONS No  FALLS:  Has patient fallen in last 6 months? No  LIVING ENVIRONMENT: Lives with: lives with their family   OCCUPATION: work from home, clinical performance LPN  PLOF: Independent  PATIENT GOALS to have less pain   PERTINENT HISTORY:  PCOS, obesity, anxiety, B12 deficiency  Sexual abuse: No  BOWEL MOVEMENT Pain with bowel movement: No Type of bowel movement:Type [employer]  Stool Scale) 4, Frequency daily, and Strain No Fully empty rectum: Yes:   Leakage: No Pads: No Fiber supplement: No  URINATION Pain with urination: No Fully empty bladder: Yes:   Stream: Strong Urgency: No Frequency: every hour during the day but sometimes longer Leakage:  "sometimes if I wait too long".  Pads: No  INTERCOURSE Pain with intercourse: Initial Penetration Ability to have vaginal penetration:  Yes: does feel dryness sometimes  Climax: not painful Marinoff Scale: 0/3  PREGNANCY Vaginal deliveries 3 Tearing No C-section deliveries 0 Currently pregnant No  PROLAPSE None    OBJECTIVE:   DIAGNOSTIC FINDINGS:    COGNITION:  Overall cognitive status: Within functional limits for tasks assessed     SENSATION:  Light touch: Appears intact  Proprioception: Appears intact  MUSCLE LENGTH: Hamstrings and adductors are limited by 50% on Rt and 25% on lt   LUMBAR SPECIAL TESTS:  Single leg stance test: Negative, SI Compression/distraction test: Negative, FABER test: Negative, and Gaenslen's test: Negative                POSTURE: rounded shoulders, forward head, and anterior pelvic tilt   PELVIC ALIGNMENT: Rt anterior pelvic tilt mildly  LUMBARAROM/PROM:  A/PROM A/PROM  eval  Flexion Limited by 25%  Extension WFL  Right lateral flexion WFL  Left lateral flexion WFL  Right rotation Limited by 25%   Left rotation Limited by 25%   (Blank rows = not tested)  LOWER EXTREMITY ROM:  WFL  LOWER EXTREMITY MMT:  Rt hip abduction slightly painful but bil hip grossly 4/5, knees ankles 5/5   PALPATION:   General  TTP Rt piriformis, Rt ASIS, Rt lateral hip abductor proximal                External Perineal Exam no TTP                             Internal Pelvic Floor TTP at Rt obturator internus   Patient confirms identification and approves PT to assess internal pelvic floor and treatment Yes  PELVIC MMT:   MMT eval  Vaginal 4/5, 5s, 8s  Internal Anal Sphincter   External Anal Sphincter   Puborectalis   Diastasis Recti   (Blank rows = not tested)        TONE: WFL  PROLAPSE: Not seen in hooklying   TODAY'S TREATMENT   01/21/22: Hamstring stretch 2x30s each leg ITB stretch 2x30s each  Bil hip abduction red loop with TA activation 2x10 Bil hip flexion red loop with TA activation 2x10 Hip shift in quad on yoga block x10 each Reverse clam shell red loop 2x10 2x10 Sit to stand 15# Modified pigeon pose 2x30s each    PATIENT EDUCATION:  Education details: [id number] Person educated: Patient Education method: Explanation, Demonstration, Tactile cues, Verbal cues, and Handouts Education comprehension: verbalized understanding and returned demonstration   HOME EXERCISE PROGRAM: [id number]  ASSESSMENT:  CLINICAL IMPRESSION: Patient session focused on hip and spine stretching for improved mobility and decreased restrictions for decreased pain and initiated core and hip strengthening. Session also initiated more core strengthening today. Pt tolerated well benefited from cues for technique. Pt would benefit from additional PT to further address deficits.     OBJECTIVE IMPAIRMENTS decreased coordination, decreased endurance, decreased strength, increased fascial restrictions, increased muscle spasms, impaired flexibility, improper body mechanics, postural dysfunction,  obesity, and pain.   ACTIVITY LIMITATIONS sleeping  PARTICIPATION LIMITATIONS: interpersonal relationship  and sleeping   PERSONAL FACTORS Time since onset of injury/illness/exacerbation and 1 comorbidity: medical history  are also affecting patient's functional outcome.   REHAB POTENTIAL: Good  CLINICAL DECISION MAKING: Stable/uncomplicated  EVALUATION COMPLEXITY: Low   GOALS: Goals reviewed with patient? Yes  SHORT TERM GOALS: Target date: 01/14/2022  Pt to be I with HEP.  Baseline: Goal status: INITIAL  2.  Pt to report no more than 3/10 pain at Rt hip for improved sleep quality.  Baseline:  Goal status: INITIAL   LONG TERM GOALS: Target date:  03/18/22    Pt to be I with advanced HEP.  Baseline:  Goal status: INITIAL  2.  Pt to report no more than 1/10 pain at Rt hip for improved ability to sleep longer without pain and improved sleep quality.  Baseline:  Goal status: INITIAL  3.  Pt to report no more than 1/10 pain with intercourse due to improved hip mobility and pelvic floor relaxation.  Baseline:  Goal status: INITIAL  4.  Pt to demonstrate improved coordination of pelvic floor and breathing mechanics and lifting mechanics to improve pelvic stability and hip mobility for less pain at least 75% of the time.  Baseline:  Goal status: INITIAL  5.  Pt to demonstrate improved mobility in spine and Rt hip with no longer having restrictions due to tension to decrease pain.  Baseline:  Goal status: INITIAL   PLAN: PT FREQUENCY: 1x/week  PT DURATION:  8 sessions  PLANNED INTERVENTIONS: Therapeutic exercises, Therapeutic activity, Neuromuscular re-education, Patient/Family education, Self Care, Joint mobilization, Aquatic Therapy, Dry Needling, Spinal mobilization, Cryotherapy, Moist heat, Taping, Biofeedback, and Manual therapy  PLAN FOR NEXT SESSION: hip stretching and spine stretching, core and hip strengthening, internal if needed and pt consents; progress  core strengthening  Stacy Gardner, PT, DPT 10/18/238:43 AM

## 2022-01-28 ENCOUNTER — Ambulatory Visit: Payer: 59 | Admitting: Physical Therapy

## 2022-01-28 DIAGNOSIS — M6281 Muscle weakness (generalized): Secondary | ICD-10-CM

## 2022-01-28 DIAGNOSIS — R279 Unspecified lack of coordination: Secondary | ICD-10-CM

## 2022-01-28 NOTE — Therapy (Signed)
OUTPATIENT PHYSICAL THERAPY FEMALE PELVIC TREATMENT   Patient Name: Crystal Salinas MRN: [id number] DOB:07-Jun-1980, 41 y.o., female Today's Date: 01/28/2022   PT End of Session - 01/28/22 0801     Visit Number 5    Date for PT Re-Evaluation 03/18/22    Authorization Type [employer]    PT Start Time 0800    PT Stop Time 0839    PT Time Calculation (min) 39 min    Activity Tolerance Patient tolerated treatment well;No increased pain    Behavior During Therapy [facility] for tasks assessed/performed             Past Medical History:  Diagnosis Date   Anxiety    Breast mass    left breast mass   Past Surgical History:  Procedure Laterality Date   tubes in ears Left 1990   Patient Active Problem List   Diagnosis Date Noted   Chronic right hip pain 12/29/2021   RLQ abdominal pain 12/29/2021   Anxiety 09/29/2017   B12 deficiency 09/29/2017   PCOS (polycystic ovarian syndrome) 08/19/2009   Obesity 08/19/2009    PCP: Inda Coke, PA  REFERRING PROVIDER: Christin Fudge, CNM  REFERRING DIAG: [phone] (ICD-10-CM) - Encounter for gynecological examination with Papanicolaou smear of cervix R10.2,G89.29 (ICD-10-CM) - Chronic pelvic pain in female  THERAPY DIAG:  Muscle weakness (generalized)  Unspecified lack of coordination  Rationale for Evaluation and Treatment Rehabilitation  ONSET DATE: 12 years ago  SUBJECTIVE:                                                                                                                                                                                           SUBJECTIVE STATEMENT: Pt reports "I have been good. There is no pain". Pt also reports she has not been waking at night with pain at all.     PAIN:  Are you having pain? no   PRECAUTIONS: None  WEIGHT BEARING RESTRICTIONS No  FALLS:  Has patient fallen in last 6 months? No  LIVING ENVIRONMENT: Lives with: lives with their family   OCCUPATION: work from  home, clinical performance LPN  PLOF: Independent  PATIENT GOALS to have less pain   PERTINENT HISTORY:  PCOS, obesity, anxiety, B12 deficiency  Sexual abuse: No  BOWEL MOVEMENT Pain with bowel movement: No Type of bowel movement:Type (Bristol Stool Scale) 4, Frequency daily, and Strain No Fully empty rectum: Yes:   Leakage: No Pads: No Fiber supplement: No  URINATION Pain with urination: No Fully empty bladder: Yes:   Stream: Strong Urgency: No Frequency: every hour during the day but sometimes longer Leakage:  "sometimes if  I wait too long".  Pads: No  INTERCOURSE Pain with intercourse: Initial Penetration Ability to have vaginal penetration:  Yes: does feel dryness sometimes  Climax: not painful Marinoff Scale: 0/3  PREGNANCY Vaginal deliveries 3 Tearing No C-section deliveries 0 Currently pregnant No  PROLAPSE None    OBJECTIVE:   DIAGNOSTIC FINDINGS:    COGNITION:  Overall cognitive status: Within functional limits for tasks assessed     SENSATION:  Light touch: Appears intact  Proprioception: Appears intact  MUSCLE LENGTH: Hamstrings and adductors are limited by 50% on Rt and 25% on lt   LUMBAR SPECIAL TESTS:  Single leg stance test: Negative, SI Compression/distraction test: Negative, FABER test: Negative, and Gaenslen's test: Negative                POSTURE: rounded shoulders, forward head, and anterior pelvic tilt   PELVIC ALIGNMENT: Rt anterior pelvic tilt mildly  LUMBARAROM/PROM:  A/PROM A/PROM  eval  Flexion Limited by 25%  Extension WFL  Right lateral flexion WFL  Left lateral flexion WFL  Right rotation Limited by 25%  Left rotation Limited by 25%   (Blank rows = not tested)  LOWER EXTREMITY ROM:  WFL  LOWER EXTREMITY MMT:  Rt hip abduction slightly painful but bil hip grossly 4/5, knees ankles 5/5   PALPATION:   General  TTP Rt piriformis, Rt ASIS, Rt lateral hip abductor proximal                External Perineal  Exam no TTP                             Internal Pelvic Floor TTP at Rt obturator internus   Patient confirms identification and approves PT to assess internal pelvic floor and treatment Yes  PELVIC MMT:   MMT eval  Vaginal 4/5, 5s, 8s  Internal Anal Sphincter   External Anal Sphincter   Puborectalis   Diastasis Recti   (Blank rows = not tested)        TONE: WFL  PROLAPSE: Not seen in hooklying   TODAY'S TREATMENT   01/28/22: Manual: Pt consented to internal vaginal assessment this date and found to have no TTP and no tightness noted with palpation of superficial and deep muscle layers. Pt reported this felt much better than compared to eval.  Therapeutic exercise: Bil hamstring stretch 2x30s   Bil ITB stretch 2x30s each  Piriformis 2x30s each Hooklying hip abduction blue band 2x10, then x10 pulses Reverse clam shell blue band  2x10 Hip machine: 55# x12 abduction, extension    PATIENT EDUCATION:  Education details: [id number] Person educated: Patient Education method: Explanation, Demonstration, Tactile cues, Verbal cues, and Handouts Education comprehension: verbalized understanding and returned demonstration   HOME EXERCISE PROGRAM: [id number]  ASSESSMENT:  CLINICAL IMPRESSION: Patient session focused on hip and spine stretching for improved mobility and decreased restrictions for decreased pain and initiated core and hip strengthening and rechecking internal pelvic floor as pt had pain with palpation at eval, today no TTP throughout pelvic floor noted by pt. Session also initiated increased challenge of hip and core strength today with pt denying all pain during session and at end of session. Pt tolerated well benefited from cues for technique. Pt would benefit from additional PT to further address deficits.     OBJECTIVE IMPAIRMENTS decreased coordination, decreased endurance, decreased strength, increased fascial restrictions, increased muscle spasms, impaired  flexibility, improper body mechanics, postural dysfunction, obesity,  and pain.   ACTIVITY LIMITATIONS sleeping  PARTICIPATION LIMITATIONS: interpersonal relationship and sleeping   PERSONAL FACTORS Time since onset of injury/illness/exacerbation and 1 comorbidity: medical history  are also affecting patient's functional outcome.   REHAB POTENTIAL: Good  CLINICAL DECISION MAKING: Stable/uncomplicated  EVALUATION COMPLEXITY: Low   GOALS: Goals reviewed with patient? Yes  SHORT TERM GOALS: Target date: 01/14/2022  Pt to be I with HEP.  Baseline: Goal status: INITIAL  2.  Pt to report no more than 3/10 pain at Rt hip for improved sleep quality.  Baseline:  Goal status: INITIAL   LONG TERM GOALS: Target date:  03/18/22    Pt to be I with advanced HEP.  Baseline:  Goal status: INITIAL  2.  Pt to report no more than 1/10 pain at Rt hip for improved ability to sleep longer without pain and improved sleep quality.  Baseline:  Goal status: INITIAL  3.  Pt to report no more than 1/10 pain with intercourse due to improved hip mobility and pelvic floor relaxation.  Baseline:  Goal status: INITIAL  4.  Pt to demonstrate improved coordination of pelvic floor and breathing mechanics and lifting mechanics to improve pelvic stability and hip mobility for less pain at least 75% of the time.  Baseline:  Goal status: INITIAL  5.  Pt to demonstrate improved mobility in spine and Rt hip with no longer having restrictions due to tension to decrease pain.  Baseline:  Goal status: INITIAL   PLAN: PT FREQUENCY: 1x/week  PT DURATION:  8 sessions  PLANNED INTERVENTIONS: Therapeutic exercises, Therapeutic activity, Neuromuscular re-education, Patient/Family education, Self Care, Joint mobilization, Aquatic Therapy, Dry Needling, Spinal mobilization, Cryotherapy, Moist heat, Taping, Biofeedback, and Manual therapy  PLAN FOR NEXT SESSION: hip stretching and spine stretching, core and hip  strengthening, internal if needed and pt consents; progress core strengthening  Stacy Gardner, PT, DPT 10/25/238:40 AM

## 2022-02-04 ENCOUNTER — Ambulatory Visit: Payer: 59 | Admitting: Physical Therapy

## 2022-02-09 NOTE — Progress Notes (Deleted)
   I, [name], LAT, ATC acting as a scribe for Crystal Leader, MD.  Crystal Salinas is a 41 y.o. female who presents to [facility] today for f/u chronic R hip pain. This issue has been evaluated by OBGYN and PCP and has a hx of ovarian cysts. Pt was last seen by Dr. Georgina Snell on 01/06/22 and was advised to cont pelvic PT, completing 5 visits. Today, pt reports  Dx testing: 01/06/22 L-spine & R hip XR 12/31/21 Pelvic US   Pertinent review of systems: ***  Relevant historical information: ***   Exam:  There were no vitals taken for this visit. General: Well Developed, well nourished, and in no acute distress.   MSK: ***    Lab and Radiology Results No results found for this or any previous visit (from the past 72 hour(s)). No results found.     Assessment and Plan: 41 y.o. female with ***   PDMP not reviewed this encounter. No orders of the defined types were placed in this encounter.  No orders of the defined types were placed in this encounter.    Discussed warning signs or symptoms. Please see discharge instructions. Patient expresses understanding.   ***

## 2022-02-10 ENCOUNTER — Ambulatory Visit: Payer: 59 | Admitting: Family Medicine

## 2022-02-11 ENCOUNTER — Ambulatory Visit: Payer: 59 | Admitting: Physical Therapy

## 2022-02-20 ENCOUNTER — Ambulatory Visit: Payer: 59 | Attending: Advanced Practice Midwife | Admitting: Physical Therapy

## 2022-02-20 DIAGNOSIS — R279 Unspecified lack of coordination: Secondary | ICD-10-CM | POA: Insufficient documentation

## 2022-02-20 DIAGNOSIS — M62838 Other muscle spasm: Secondary | ICD-10-CM | POA: Insufficient documentation

## 2022-02-20 DIAGNOSIS — M6281 Muscle weakness (generalized): Secondary | ICD-10-CM | POA: Insufficient documentation

## 2022-02-25 ENCOUNTER — Ambulatory Visit: Payer: 59 | Admitting: Physical Therapy

## 2022-03-04 ENCOUNTER — Encounter: Payer: 59 | Admitting: Physical Therapy

## 2022-03-11 ENCOUNTER — Encounter: Payer: 59 | Admitting: Physical Therapy

## 2022-03-19 ENCOUNTER — Encounter: Payer: Self-pay | Admitting: Physician Assistant

## 2022-03-19 ENCOUNTER — Encounter: Payer: Self-pay | Admitting: *Deleted

## 2022-03-20 ENCOUNTER — Other Ambulatory Visit: Payer: Self-pay | Admitting: Physician Assistant

## 2022-03-20 DIAGNOSIS — E669 Obesity, unspecified: Secondary | ICD-10-CM

## 2022-09-07 ENCOUNTER — Encounter: Payer: Self-pay | Admitting: Physician Assistant

## 2022-11-04 ENCOUNTER — Encounter (INDEPENDENT_AMBULATORY_CARE_PROVIDER_SITE_OTHER): Payer: Self-pay

## 2022-11-16 ENCOUNTER — Other Ambulatory Visit (HOSPITAL_COMMUNITY): Payer: Self-pay | Admitting: Physician Assistant

## 2022-11-16 DIAGNOSIS — Z1231 Encounter for screening mammogram for malignant neoplasm of breast: Secondary | ICD-10-CM

## 2022-11-17 ENCOUNTER — Encounter: Payer: Self-pay | Admitting: Physician Assistant

## 2022-11-17 ENCOUNTER — Ambulatory Visit (INDEPENDENT_AMBULATORY_CARE_PROVIDER_SITE_OTHER): Payer: 59 | Admitting: Physician Assistant

## 2022-11-17 VITALS — BP 120/70 | HR 81 | Temp 97.7°F | Ht 67.0 in | Wt 288.4 lb

## 2022-11-17 DIAGNOSIS — M25551 Pain in right hip: Secondary | ICD-10-CM

## 2022-11-17 DIAGNOSIS — Z0000 Encounter for general adult medical examination without abnormal findings: Secondary | ICD-10-CM | POA: Diagnosis not present

## 2022-11-17 DIAGNOSIS — E782 Mixed hyperlipidemia: Secondary | ICD-10-CM

## 2022-11-17 DIAGNOSIS — E282 Polycystic ovarian syndrome: Secondary | ICD-10-CM | POA: Diagnosis not present

## 2022-11-17 DIAGNOSIS — E669 Obesity, unspecified: Secondary | ICD-10-CM | POA: Diagnosis not present

## 2022-11-17 DIAGNOSIS — E538 Deficiency of other specified B group vitamins: Secondary | ICD-10-CM

## 2022-11-17 DIAGNOSIS — E559 Vitamin D deficiency, unspecified: Secondary | ICD-10-CM

## 2022-11-17 DIAGNOSIS — Z1159 Encounter for screening for other viral diseases: Secondary | ICD-10-CM

## 2022-11-17 DIAGNOSIS — G8929 Other chronic pain: Secondary | ICD-10-CM

## 2022-11-17 LAB — VITAMIN D 25 HYDROXY (VIT D DEFICIENCY, FRACTURES): VITD: 18.84 ng/mL — ABNORMAL LOW (ref 30.00–100.00)

## 2022-11-17 LAB — LIPID PANEL
Cholesterol: 189 mg/dL (ref 0–200)
HDL: 41 mg/dL (ref >39.00)
LDL Cholesterol: 122 mg/dL — ABNORMAL HIGH (ref 0–99)
NonHDL: 148.22
Total CHOL/HDL Ratio: 5
Triglycerides: 129 mg/dL (ref 0.0–149.0)
VLDL: 25.8 mg/dL (ref 0.0–40.0)

## 2022-11-17 LAB — CBC WITH DIFFERENTIAL/PLATELET
Basophils Absolute: 0 10*3/uL (ref 0.0–0.1)
Basophils Relative: 0.8 % (ref 0.0–3.0)
Eosinophils Absolute: 0.1 10*3/uL (ref 0.0–0.7)
Eosinophils Relative: 1.1 % (ref 0.0–5.0)
HCT: 38.5 % (ref 36.0–46.0)
Hemoglobin: 12 g/dL (ref 12.0–15.0)
Lymphocytes Relative: 36.9 % (ref 12.0–46.0)
Lymphs Abs: 2.1 10*3/uL (ref 0.7–4.0)
MCHC: 31.2 g/dL (ref 30.0–36.0)
MCV: 76.8 fl — ABNORMAL LOW (ref 78.0–100.0)
Monocytes Absolute: 0.4 10*3/uL (ref 0.1–1.0)
Monocytes Relative: 6.3 % (ref 3.0–12.0)
Neutro Abs: 3.1 10*3/uL (ref 1.4–7.7)
Neutrophils Relative %: 54.9 % (ref 43.0–77.0)
Platelets: 463 10*3/uL — ABNORMAL HIGH (ref 150.0–400.0)
RBC: 5.01 Mil/uL (ref 3.87–5.11)
RDW: 16.6 % — ABNORMAL HIGH (ref 11.5–15.5)
WBC: 5.6 10*3/uL (ref 4.0–10.5)

## 2022-11-17 LAB — COMPREHENSIVE METABOLIC PANEL
ALT: 16 U/L (ref 0–35)
AST: 17 U/L (ref 0–37)
Albumin: 4.1 g/dL (ref 3.5–5.2)
Alkaline Phosphatase: 82 U/L (ref 39–117)
BUN: 12 mg/dL (ref 6–23)
CO2: 28 mEq/L (ref 19–32)
Calcium: 9.2 mg/dL (ref 8.4–10.5)
Chloride: 103 mEq/L (ref 96–112)
Creatinine, Ser: 0.83 mg/dL (ref 0.40–1.20)
GFR: 85.95 mL/min (ref >60.00)
Glucose, Bld: 95 mg/dL (ref 70–99)
Potassium: 4.1 mEq/L (ref 3.5–5.1)
Sodium: 138 mEq/L (ref 135–145)
Total Bilirubin: 0.3 mg/dL (ref 0.2–1.2)
Total Protein: 7.1 g/dL (ref 6.0–8.3)

## 2022-11-17 LAB — HEMOGLOBIN A1C: Hgb A1c MFr Bld: 6.1 % (ref 4.6–6.5)

## 2022-11-17 LAB — VITAMIN B12: Vitamin B-12: 265 pg/mL (ref 211–911)

## 2022-11-17 NOTE — Progress Notes (Signed)
Subjective:    Crystal Salinas is a 42 y.o. female and is here for a comprehensive physical exam.  HPI  Health Maintenance Due  Topic Date Due   Hepatitis C Screening  Never done    Acute Concerns: None  Chronic Issues: Obesity/Prediabetes States that she has been experiencing swelling in her ankles, but only when she is sitting at her desk for extended periods.  Reports that she has tried incorporating a walking pad and pedal machine at her desk. Notes that she hasn't used them as often as she'd like due to abnormality of walking and typing.  Also reports that she is eating healthy, incorporating intermittent fasting, green juice, more She does not want to restart metformin if needed  Hip Pain (Right): Reports she is still experiencing right hip pain.  States the pain does not affect mobility during the day, but affects her when trying to fall asleep.  Notes she has tried physical therapy with minor relief.  Saw sports medicine on 01/06/2022  Health Maintenance: Immunizations -- UTD Colonoscopy -- N/A (due in 1 year). Mammogram -- Last done 11/19/21. Results were normal.  PAP -- Last done 11/27/21. Results were normal.  Bone Density -- N/A Diet -- Reports she begun intermittent fasting and incorporating green juice, more fruits and vegetables, and lessened meat intake.  Exercise -- Has been working out more.  Sleep habits -- Fair sleep quality. No concerns.  Mood -- Stable.   UTD with dentist? - Yes UTD with eye doctor? - No, states she will schedule.   Weight history: Wt Readings from Last 10 Encounters:  11/17/22 288 lb 6.1 oz (130.8 kg)  01/06/22 270 lb (122.5 kg)  12/29/21 271 lb (122.9 kg)  11/27/21 272 lb (123.4 kg)  11/13/21 268 lb 12.8 oz (121.9 kg)  04/08/20 277 lb 5.4 oz (125.8 kg)  11/03/19 (!) 277 lb 6.1 oz (125.8 kg)  07/21/19 270 lb (122.5 kg)  01/20/19 270 lb 3.2 oz (122.6 kg)  08/03/18 284 lb (128.8 kg)   Body mass index is 45.17  kg/m. Patient's last menstrual period was 11/10/2022 (exact date).  Alcohol use:  reports no history of alcohol use.  Tobacco use:  Tobacco Use: Low Risk  (11/17/2022)   Patient History    Smoking Tobacco Use: Never    Smokeless Tobacco Use: Never    Passive Exposure: Not on file   Eligible for lung cancer screening? no     11/17/2022    8:30 AM  Depression screen PHQ 2/9  Decreased Interest 0  Down, Depressed, Hopeless 0  PHQ - 2 Score 0     Other providers/specialists: Patient Care Team: Jarold Motto, [place] as PCP - General (Physician Assistant)    PMHx, SurgHx, SocialHx, Medications, and Allergies were reviewed in the Visit Navigator and updated as appropriate.   Past Medical History:  Diagnosis Date   Allergy    seasonals   Anxiety    Breast mass    left breast mass   GERD (gastroesophageal reflux disease)    during pregancies   Heart murmur    birth     Past Surgical History:  Procedure Laterality Date   tubes in ears Left 1990     Family History  Problem Relation Age of Onset   Lung cancer Mother        mets to brain; lifelong smoker, chemo, radiation   Multiple sclerosis Father        no significant deficits  Diabetes Maternal Grandmother    Emphysema Maternal Grandmother    Stroke Paternal Grandmother    Hypertension Paternal Grandmother    Asthma Daughter    Eczema Daughter    Cancer Maternal Aunt    Leukemia Maternal Aunt        CLL    Social History   Tobacco Use   Smoking status: Never   Smokeless tobacco: Never  Vaping Use   Vaping status: Never Used  Substance Use Topics   Alcohol use: No   Drug use: No    Review of Systems:   Review of Systems  Constitutional:  Negative for chills, fever, malaise/fatigue and weight loss.  HENT:  Negative for hearing loss, sinus pain and sore throat.   Respiratory:  Negative for cough and hemoptysis.   Cardiovascular:  Positive for leg swelling (ankles, after extended sedentary  periods). Negative for chest pain, palpitations and PND.  Gastrointestinal:  Negative for abdominal pain, constipation, diarrhea, heartburn, nausea and vomiting.  Genitourinary:  Negative for dysuria, frequency and urgency.  Musculoskeletal:  Positive for myalgias (intermittent cramping, non-focal (See HPI)). Negative for back pain and neck pain.       (+) Hip pain (right)  Skin:  Negative for itching and rash.  Neurological:  Negative for dizziness, tingling, seizures and headaches.  Endo/Heme/Allergies:  Negative for polydipsia.  Psychiatric/Behavioral:  Negative for depression. The patient is not nervous/anxious.     Objective:   BP 120/70 (BP Location: Left Arm, Patient Position: Sitting, Cuff Size: Large)   Pulse 81   Temp 97.7 F (36.5 C) (Temporal)   Ht 5\' 7"  (1.702 m)   Wt 288 lb 6.1 oz (130.8 kg)   LMP 11/10/2022 (Exact Date)   SpO2 97%   BMI 45.17 kg/m  Body mass index is 45.17 kg/m.   General Appearance:    Alert, cooperative, no distress, appears stated age  Head:    Normocephalic, without obvious abnormality, atraumatic  Eyes:    PERRL, conjunctiva/corneas clear, EOM's intact, fundi    benign, both eyes  Ears:    Normal TM's and external ear canals, both ears  Nose:   Nares normal, septum midline, mucosa normal, no drainage    or sinus tenderness  Throat:   Lips, mucosa, and tongue normal; teeth and gums normal  Neck:   Supple, symmetrical, trachea midline, no adenopathy;    thyroid:  no enlargement/tenderness/nodules; no carotid   bruit or JVD  Back:     Symmetric, no curvature, ROM normal, no CVA tenderness  Lungs:     Clear to auscultation bilaterally, respirations unlabored  Chest Wall:    No tenderness or deformity   Heart:    Regular rate and rhythm, S1 and S2 normal, no murmur, rub or gallop  Breast Exam:    Deferred  Abdomen:     Soft, non-tender, bowel sounds active all four quadrants,    no masses, no organomegaly  Genitalia:    Deferred   Extremities:   Extremities normal, atraumatic, no cyanosis or edema  Pulses:   2+ and symmetric all extremities  Skin:   Skin color, texture, turgor normal, no rashes or lesions  Lymph nodes:   Cervical, supraclavicular, and axillary nodes normal  Neurologic:   CNII-XII intact, normal strength, sensation and reflexes    throughout    Assessment/Plan:   Routine physical examination Today patient counseled on age appropriate routine health concerns for screening and prevention, each reviewed and up to date or declined. Immunizations  reviewed and up to date or declined. Labs ordered and reviewed. Risk factors for depression reviewed and negative. Hearing function and visual acuity are intact. ADLs screened and addressed as needed. Functional ability and level of safety reviewed and appropriate. Education, counseling and referrals performed based on assessed risks today. Patient provided with a copy of personalized plan for preventive services.  Obesity, unspecified classification, unspecified obesity type, unspecified whether serious comorbidity present Continue efforts at healthy weight loss Did discuss GLP-1's with patient - -she is going to look into them and see if she has insurance coverage - will message [place] if she would like to trial this  Vitamin D deficiency Update blood work and provide recommendations  PCOS (polycystic ovarian syndrome) Update blood work and provide recommendations  Mixed hyperlipidemia Update blood work and provide recommendations  Encounter for screening for other viral diseases Update hepatitis C screening  B12 deficiency Update blood work and provide recommendations  Chronic right hip pain Recommend returning to sports medicine vs physical therapy -- she will reach out if she needs our referral for this  [employer] as a Neurosurgeon for [employer], PA.,have documented all relevant documentation on the behalf of Jarold Motto, PA,as directed  by  Jarold Motto, PA while in the presence of Jarold Motto, [place].  I, Jarold Motto, [place], have reviewed all documentation for this visit. The documentation on 11/17/22 for the exam, diagnosis, procedures, and orders are all accurate and complete.  Jarold Motto, PA-C [facility]

## 2022-11-17 NOTE — Patient Instructions (Addendum)
It was great to see you!  Look to see if your insurance covers either Wegovy or Zepbound and we can try to send this in if you are interested  If your hip pain persists, let me know and we will either send you back to sports medicine or physical therapy   Please go to the lab for blood work.   Our office will call you with your results unless you have chosen to receive results via MyChart.  If your blood work is normal we will follow-up each year for physicals and as scheduled for chronic medical problems.  If anything is abnormal we will treat accordingly and get you in for a follow-up.  Take care,  Lelon Mast

## 2022-11-18 ENCOUNTER — Other Ambulatory Visit: Payer: Self-pay | Admitting: Physician Assistant

## 2022-11-18 DIAGNOSIS — R7989 Other specified abnormal findings of blood chemistry: Secondary | ICD-10-CM

## 2022-11-18 MED ORDER — VITAMIN D (ERGOCALCIFEROL) 1.25 MG (50000 UNIT) PO CAPS: 50000.0000 [IU] | ORAL_CAPSULE | ORAL | 0 refills | Status: DC

## 2022-11-23 ENCOUNTER — Ambulatory Visit (HOSPITAL_COMMUNITY)
Admission: RE | Admit: 2022-11-23 | Discharge: 2022-11-23 | Disposition: A | Discharge status: Home / Self Care | Payer: 59 | Source: Ambulatory Visit | Attending: Physician Assistant | Admitting: Physician Assistant

## 2022-11-23 DIAGNOSIS — Z1231 Encounter for screening mammogram for malignant neoplasm of breast: Secondary | ICD-10-CM | POA: Diagnosis present

## 2023-02-15 ENCOUNTER — Telehealth: Payer: Self-pay | Admitting: Physician Assistant

## 2023-02-15 NOTE — Telephone Encounter (Signed)
Patient made an OV for 11/13 w/ PCP due to dizziness x 3 days. I called patient and got more information before getting her triaged.  FYI: This call has been transferred to triage nurse: Access Nurse. Once the result note has been entered staff can address the message at that time.  Patient called in with the following symptoms:  Red Word:dizziness    Please advise at Mobile [phone] (mobile)  Message is routed to Provider Pool.

## 2023-02-15 NOTE — Telephone Encounter (Signed)
See triage note.

## 2023-02-15 NOTE — Progress Notes (Signed)
Crystal Salinas is a 42 y.o. female here for a new problem. History of Present Illness:  No chief complaint on file.  HPI Dizziness: Reported via phone call 11/11 that she developed dizziness 3 days ago on 11/08. Stated moderate dizziness occurs at night when she's laying down.   Denied injury in the past 5 days, difficulty breathing, or chest pain.  Noted that she received new prescriptions lenses a few weeks ago and has had to take them off a couple of time due to slow adjustment towards stronger prescription.   Today she presents to discuss her acute symptom(s).  *** *** ***  *** *** *** *** *** Past Medical History:  Diagnosis Date   Allergy    seasonals   Anxiety    Breast mass    left breast mass   GERD (gastroesophageal reflux disease)    during pregancies   Heart murmur    birth    Social History   Tobacco Use   Smoking status: Never   Smokeless tobacco: Never  Vaping Use   Vaping status: Never Used  Substance Use Topics   Alcohol use: No   Drug use: No   Past Surgical History:  Procedure Laterality Date   tubes in ears Left 1990   Family History  Problem Relation Age of Onset   Lung cancer Mother        mets to brain; lifelong smoker, chemo, radiation   Multiple sclerosis Father        no significant deficits   Diabetes Maternal Grandmother    Emphysema Maternal Grandmother    Stroke Paternal Grandmother    Hypertension Paternal Grandmother    Asthma Daughter    Eczema Daughter    Cancer Maternal Aunt    Leukemia Maternal Aunt        CLL   Allergies  Allergen Reactions   Amoxicillin Rash   Current Medications:   Current Outpatient Medications:    Ascorbic Acid (VITAMIN C) 500 MG CAPS, Take 1 capsule by mouth daily., Disp: , Rfl:    Cholecalciferol (VITAMIN D3) 250 MCG (10000 UT) TABS, Take 1 tablet by mouth daily., Disp: , Rfl:    Magnesium 250 MG TABS, Take 1 tablet every other day by mouth., Disp: , Rfl:    Multiple  Vitamins-Minerals (ZINC PO), Take 1 capsule by mouth daily., Disp: , Rfl:    Omega-3 Fatty Acids (OMEGA-3 FISH OIL PO), Take 1 capsule daily by mouth., Disp: , Rfl:    OVER THE COUNTER MEDICATION, Seamoss 1 tablespoon daily, has Elderberry, Burduck root, Bladder Whack, Disp: , Rfl:    POTASSIUM CHLORIDE PO, Take 1 tablet by mouth every other day., Disp: , Rfl:    pyridOXINE (VITAMIN B-6) 100 MG tablet, Take 100 mg by mouth daily., Disp: , Rfl:    Vitamin D, Ergocalciferol, (DRISDOL) 1.25 MG (50000 UNIT) CAPS capsule, Take 1 capsule (50,000 Units total) by mouth every 7 (seven) days., Disp: 12 capsule, Rfl: 0  Review of Systems:   ROS See pertinent positives and negatives as per the HPI.  Vitals:   There were no vitals filed for this visit.   There is no height or weight on file to calculate BMI.  Physical Exam:   Physical Exam  Assessment and Plan:   There are no diagnoses linked to this encounter.            I,Emily Lagle,acting as a Neurosurgeon for [employer], PA.,have documented all relevant documentation on the  behalf of Jarold Motto, PA,as directed by  Jarold Motto, PA while in the presence of Jarold Motto, [place].  *** (refresh reminder)  I, Jarold Motto, PA, have reviewed all documentation for this visit. The documentation on 02/15/23 for the exam, diagnosis, procedures, and orders are all accurate and complete.  Jarold Motto, PA-C

## 2023-02-15 NOTE — Telephone Encounter (Signed)
Final Outcome: See PCP within 24 hours. Patient has been rescheduled for 02/16/23 @ 3:20pm with PCP.    Patient Name First: Crystal Last: Salinas Gender: Female DOB: July 07, 1980 Age: 42 Y 3 M 22 D Return Phone Number: [phone] (Primary), [phone] (Secondary) Address: City/ State/ Zip: [place] [address] Client [facility] [employer] [facility] Provider Bufford Buttner, [place]- PA Contact Type Call Who Is Calling Patient / Member / Family / Caregiver Call Type Triage / Clinical Relationship To Patient Self Return Phone Number [phone] (Primary) Chief Complaint Dizziness Reason for Call Symptomatic / Request for Health Information Initial Comment Caller states she is having dizziness since Friday. Caller states it is mostly at night when she is laying down, she just got new glasses a couple weeks ago and has to take them off due to them being strong than she is use to. Translation No Nurse Assessment Nurse: Charna Elizabeth, RN, Cathy Date/Time (Eastern Time): 02/15/2023 10:36:07 AM Confirm and document reason for call. If symptomatic, describe symptoms. ---Crystal Salinas states she developed dizziness about 3 days ago. She got some new glasses a few weeks ago that are stronger than her previous glasses. No severe breathing difficulty or chest pain. No injury in the past 5 days. Alert and responsive. Does the patient have any new or worsening symptoms? ---Yes Will a triage be completed? ---Yes Related visit to physician within the last 2 weeks? ---No Does the PT have any chronic conditions? (i.e. diabetes, asthma, this includes High risk factors for pregnancy, etc.) ---No Is the patient pregnant or possibly pregnant? (Ask all females between the ages of 69-55) ---No Is this a behavioral health or substance abuse call? ---No Guidelines Guideline Title Affirmed Question Affirmed Notes Nurse Date/Time  (Eastern Time) Dizziness - Lightheadedness [1] MODERATE dizziness (e.g., interferes with Charna Elizabeth, RN, Cathy 02/15/2023 10:38:43 AM Guidelines Guideline Title Affirmed Question Affirmed Notes Nurse Date/Time (Eastern Time) normal activities) AND [2] has NOT been evaluated by doctor (or NP/PA) for this (Exception: Dizziness caused by heat exposure, sudden standing, or poor fluid intake.) Disp. Time Lamount Cohen Time) Disposition Final User 02/15/2023 10:08:09 AM Attempt made - message left Charna Elizabeth, RN, [facility] 02/15/2023 10:11:05 AM Attempt made - line busy Charna Elizabeth, RN, Lynden Ang 02/15/2023 10:42:40 AM See PCP within 24 Hours Yes Charna Elizabeth, RN, Lynden Ang Final Disposition 02/15/2023 10:42:40 AM See PCP within 24 Hours Yes [place], RN, Frann Rider Disagree/Comply Comply Caller Understands Yes PreDisposition Call Doctor Care Advice Given Per Guideline SEE PCP WITHIN 24 HOURS: * IF OFFICE WILL BE OPEN: You need to be examined within the next 24 hours. Call your doctor (or NP/PA) when the office opens and make an appointment. LIE DOWN AND REST: * Lie down with feet elevated for 1 hour. * This will improve blood flow through the body and to the brain. DRINK FLUIDS: * Drink several glasses of fruit juice, other clear fluids or water. * This will improve hydration and blood sugar levels. CALL BACK IF: * You pass out (faint) or are too weak to stand * You become worse CARE ADVICE given per Dizziness - Lightheadedness (Adult) guideline. Comments User: Colonel Bald, RN Date/Time Lamount Cohen Time): 02/15/2023 10:48:15 AM Connected with [name] to check on appointment options. Referrals REFERRED TO PCP OFFICE

## 2023-02-16 ENCOUNTER — Ambulatory Visit (INDEPENDENT_AMBULATORY_CARE_PROVIDER_SITE_OTHER): Payer: 59 | Admitting: Physician Assistant

## 2023-02-16 ENCOUNTER — Encounter: Payer: Self-pay | Admitting: Physician Assistant

## 2023-02-16 VITALS — BP 120/76 | HR 85 | Temp 98.0°F | Ht 67.0 in | Wt 286.2 lb

## 2023-02-16 DIAGNOSIS — R42 Dizziness and giddiness: Secondary | ICD-10-CM | POA: Diagnosis not present

## 2023-02-16 NOTE — Patient Instructions (Signed)
It was great to see you!  64 oz water daily  Try the exercises from your husband  Choose the lens you like and stick with it  Report back on symptom(s)  If anything new comes up let me know   Take care,  Jarold Motto PA-C

## 2023-02-17 ENCOUNTER — Ambulatory Visit: Payer: 59 | Admitting: Physician Assistant

## 2023-04-05 ENCOUNTER — Telehealth: Payer: Self-pay | Admitting: Physician Assistant

## 2023-04-05 NOTE — Telephone Encounter (Signed)
Patient dropped off document health form , to be filled out by provider. Patient requested to send it back via Fax to [phone] within 2-days. Document is located in providers tray at front office.Please advise at Mobile [phone] (mobile)

## 2023-04-06 NOTE — Telephone Encounter (Signed)
 Left message on voicemail to call office. Form was completed and faxed to [phone].

## 2023-04-08 NOTE — Telephone Encounter (Signed)
 Spoke to pt told her Biometric form was completed and faxed over 2 days ago. Pt verbalized understanding.

## 2023-11-16 ENCOUNTER — Other Ambulatory Visit (HOSPITAL_COMMUNITY): Payer: Self-pay | Admitting: Physician Assistant

## 2023-11-16 DIAGNOSIS — Z1231 Encounter for screening mammogram for malignant neoplasm of breast: Secondary | ICD-10-CM

## 2023-11-29 ENCOUNTER — Ambulatory Visit (HOSPITAL_COMMUNITY)
Admission: RE | Admit: 2023-11-29 | Discharge: 2023-11-29 | Disposition: A | Discharge status: Home / Self Care | Source: Ambulatory Visit | Attending: Physician Assistant | Admitting: Physician Assistant

## 2023-11-29 ENCOUNTER — Encounter: Payer: Self-pay | Admitting: Physician Assistant

## 2023-11-29 ENCOUNTER — Ambulatory Visit (INDEPENDENT_AMBULATORY_CARE_PROVIDER_SITE_OTHER): Admitting: Physician Assistant

## 2023-11-29 VITALS — BP 120/82 | HR 71 | Temp 98.4°F | Ht 67.0 in | Wt 291.0 lb

## 2023-11-29 DIAGNOSIS — E559 Vitamin D deficiency, unspecified: Secondary | ICD-10-CM

## 2023-11-29 DIAGNOSIS — Z1211 Encounter for screening for malignant neoplasm of colon: Secondary | ICD-10-CM

## 2023-11-29 DIAGNOSIS — Z131 Encounter for screening for diabetes mellitus: Secondary | ICD-10-CM | POA: Diagnosis not present

## 2023-11-29 DIAGNOSIS — D509 Iron deficiency anemia, unspecified: Secondary | ICD-10-CM

## 2023-11-29 DIAGNOSIS — Z0000 Encounter for general adult medical examination without abnormal findings: Secondary | ICD-10-CM

## 2023-11-29 DIAGNOSIS — Z13220 Encounter for screening for lipoid disorders: Secondary | ICD-10-CM

## 2023-11-29 DIAGNOSIS — Z1231 Encounter for screening mammogram for malignant neoplasm of breast: Secondary | ICD-10-CM | POA: Insufficient documentation

## 2023-11-29 DIAGNOSIS — E282 Polycystic ovarian syndrome: Secondary | ICD-10-CM | POA: Diagnosis not present

## 2023-11-29 DIAGNOSIS — E538 Deficiency of other specified B group vitamins: Secondary | ICD-10-CM

## 2023-11-29 NOTE — Progress Notes (Signed)
 Subjective:    Crystal Salinas is a 43 y.o. female and is here for a comprehensive physical exam.  HPI  Health Maintenance Due  Topic Date Due   Colonoscopy  Never done   MAMMOGRAM  11/23/2023    Discussed the use of AI scribe software for clinical note transcription with the patient, who gave verbal consent to proceed.  History of Present Illness Crystal Salinas is a 43 year old female who presents for a routine follow-up visit.  She engages in regular physical activity, including cycling, weight training, and a 30-minute workout app with body weight exercises and resistance bands. She experiences muscle soreness, likely due to muscle building. Her dietary changes include fasting, juicing, and reducing sugar intake, with a focus on natural sugars from fruits. She uses Berberine but is uncertain of its effects and is considering appetite suppressants.  She reports good sleep quality and no mental health concerns. She uses a standing desk and pedal exerciser at home. She denies alcohol consumption and smoking.  Her family history includes her father with multiple sclerosis and her mother with lung cancer. Some siblings have hypertension, and one is borderline diabetic.   Health Maintenance: Immunizations -- UpToDate; encouraged flu shot Colonoscopy -- due  Mammogram -- completed today PAP -- utd Bone Density -- N/A Diet -- meeting with nutritionist Exercise -- cardiovascular exercise and strength training; MomBoss App  Sleep habits -- doing well with sleep Mood -- stable  UTD with dentist? - yes UTD with eye doctor? - yes   Weight history: Wt Readings from Last 10 Encounters:  11/29/23 291 lb (132 kg)  02/16/23 286 lb 4 oz (129.8 kg)  11/17/22 288 lb 6.1 oz (130.8 kg)  01/06/22 270 lb (122.5 kg)  12/29/21 271 lb (122.9 kg)  11/27/21 272 lb (123.4 kg)  11/13/21 268 lb 12.8 oz (121.9 kg)  04/08/20 277 lb 5.4 oz (125.8 kg)  11/03/19 (!) 277 lb 6.1 oz (125.8 kg)   07/21/19 270 lb (122.5 kg)   Body mass index is 45.58 kg/m. Patient's last menstrual period was 11/24/2023 (approximate).  Alcohol use:  reports no history of alcohol use.  Tobacco use:  Tobacco Use: Low Risk  (11/29/2023)   Patient History    Smoking Tobacco Use: Never    Smokeless Tobacco Use: Never    Passive Exposure: Not on file   Eligible for lung cancer screening? no     11/29/2023    2:35 PM  Depression screen PHQ 2/9  Decreased Interest 0  Down, Depressed, Hopeless 0  PHQ - 2 Score 0     Other providers/specialists: Patient Care Team: Job Lukes, [place] as PCP - General (Physician Assistant)    PMHx, SurgHx, SocialHx, Medications, and Allergies were reviewed in the Visit Navigator and updated as appropriate.   Past Medical History:  Diagnosis Date   Allergy    seasonals   Anxiety    Breast mass    left breast mass   GERD (gastroesophageal reflux disease)    during pregancies   Heart murmur    birth     Past Surgical History:  Procedure Laterality Date   tubes in ears Left 1990     Family History  Problem Relation Age of Onset   Lung cancer Mother        mets to brain; lifelong smoker, chemo, radiation   Multiple sclerosis Father        no significant deficits   Diabetes Maternal Grandmother  Emphysema Maternal Grandmother    Stroke Paternal Grandmother    Hypertension Paternal Grandmother    Asthma Daughter    Eczema Daughter    Cancer Maternal Aunt    Leukemia Maternal Aunt        CLL    Social History   Tobacco Use   Smoking status: Never   Smokeless tobacco: Never  Vaping Use   Vaping status: Never Used  Substance Use Topics   Alcohol use: No   Drug use: No    Review of Systems:   Review of Systems  Constitutional:  Negative for chills, fever, malaise/fatigue and weight loss.  HENT:  Negative for hearing loss, sinus pain and sore throat.   Respiratory:  Negative for cough and hemoptysis.   Cardiovascular:   Negative for chest pain, palpitations, leg swelling and PND.  Gastrointestinal:  Negative for abdominal pain, constipation, diarrhea, heartburn, nausea and vomiting.  Genitourinary:  Negative for dysuria, frequency and urgency.  Musculoskeletal:  Negative for back pain, myalgias and neck pain.  Skin:  Negative for itching and rash.  Neurological:  Negative for dizziness, tingling, seizures and headaches.  Endo/Heme/Allergies:  Negative for polydipsia.  Psychiatric/Behavioral:  Negative for depression. The patient is not nervous/anxious.     Objective:   BP 120/82 (BP Location: Left Arm, Patient Position: Sitting, Cuff Size: Large)   Pulse 71   Temp 98.4 F (36.9 C) (Temporal)   Ht 5' 7 (1.702 m)   Wt 291 lb (132 kg)   LMP 11/24/2023 (Approximate)   SpO2 98%   BMI 45.58 kg/m  Body mass index is 45.58 kg/m.   General Appearance:    Alert, cooperative, no distress, appears stated age  Head:    Normocephalic, without obvious abnormality, atraumatic  Eyes:    PERRL, conjunctiva/corneas clear, EOM's intact, fundi    benign, both eyes  Ears:    Normal TM's and external ear canals, both ears  Nose:   Nares normal, septum midline, mucosa normal, no drainage    or sinus tenderness  Throat:   Lips, mucosa, and tongue normal; teeth and gums normal  Neck:   Supple, symmetrical, trachea midline, no adenopathy;    thyroid :  no enlargement/tenderness/nodules; no carotid   bruit or JVD  Back:     Symmetric, no curvature, ROM normal, no CVA tenderness  Lungs:     Clear to auscultation bilaterally, respirations unlabored  Chest Wall:    No tenderness or deformity   Heart:    Regular rate and rhythm, S1 and S2 normal, no murmur, rub or gallop  Breast Exam:    Deferred  Abdomen:     Soft, non-tender, bowel sounds active all four quadrants,    no masses, no organomegaly  Genitalia:    Deferred  Extremities:   Extremities normal, atraumatic, no cyanosis or edema  Pulses:   2+ and symmetric all  extremities  Skin:   Skin color, texture, turgor normal, no rashes or lesions  Lymph nodes:   Cervical, supraclavicular, and axillary nodes normal  Neurologic:   CNII-XII intact, normal strength, sensation and reflexes    throughout    Assessment/Plan:   Assessment and Plan Assessment & Plan Adult Wellness Visit She maintained a consistent exercise routine and worked with a nutritionist. She experimented with fasting and juicing, reducing inflammation. She considered appetite suppressants but was hesitant about injections. She took Berberine and reduced sugar intake. She was up to date on mammogram and Pap smear, with a colonoscopy referral  made. No sleep or mental health issues reported. Maintained a healthy lifestyle without alcohol or smoking. - Referral for colonoscopy.  Iron deficiency anemia Suspected iron deficiency anemia related to menstrual periods, which became more regular with exercise. Previous labs showed high red blood cell count and high platelets. - Order iron deficiency workup.      Lucie Buttner, PA-C  [facility]

## 2023-11-29 NOTE — Patient Instructions (Signed)
 It was great to see you!  Please go to the lab for blood work.   Our office will call you with your results unless you have chosen to receive results via MyChart.  If your blood work is normal we will follow-up each year for physicals and as scheduled for chronic medical problems.  If anything is abnormal we will treat accordingly and get you in for a follow-up.  Take care,  Lelon Mast

## 2023-11-30 ENCOUNTER — Ambulatory Visit: Payer: Self-pay | Admitting: Physician Assistant

## 2023-11-30 LAB — COMPREHENSIVE METABOLIC PANEL WITH GFR
ALT: 23 U/L (ref 0–35)
AST: 20 U/L (ref 0–37)
Albumin: 4.1 g/dL (ref 3.5–5.2)
Alkaline Phosphatase: 79 U/L (ref 39–117)
BUN: 11 mg/dL (ref 6–23)
CO2: 29 meq/L (ref 19–32)
Calcium: 9 mg/dL (ref 8.4–10.5)
Chloride: 102 meq/L (ref 96–112)
Creatinine, Ser: 0.8 mg/dL (ref 0.40–1.20)
GFR: 89.18 mL/min (ref >60.00)
Glucose, Bld: 88 mg/dL (ref 70–99)
Potassium: 4 meq/L (ref 3.5–5.1)
Sodium: 139 meq/L (ref 135–145)
Total Bilirubin: 0.2 mg/dL (ref 0.2–1.2)
Total Protein: 7.2 g/dL (ref 6.0–8.3)

## 2023-11-30 LAB — IBC + FERRITIN
Ferritin: 13.8 ng/mL (ref 10.0–291.0)
Iron: 39 ug/dL — ABNORMAL LOW (ref 42–145)
Saturation Ratios: 11 % — ABNORMAL LOW (ref 20.0–50.0)
TIBC: 354.2 ug/dL (ref 250.0–450.0)
Transferrin: 253 mg/dL (ref 212.0–360.0)

## 2023-11-30 LAB — CBC WITH DIFFERENTIAL/PLATELET
Basophils Absolute: 0.1 K/uL (ref 0.0–0.1)
Basophils Relative: 2.2 % (ref 0.0–3.0)
Eosinophils Absolute: 0.1 K/uL (ref 0.0–0.7)
Eosinophils Relative: 0.9 % (ref 0.0–5.0)
HCT: 37.6 % (ref 36.0–46.0)
Hemoglobin: 11.8 g/dL — ABNORMAL LOW (ref 12.0–15.0)
Lymphocytes Relative: 37.7 % (ref 12.0–46.0)
Lymphs Abs: 2.4 K/uL (ref 0.7–4.0)
MCHC: 31.4 g/dL (ref 30.0–36.0)
MCV: 76.6 fl — ABNORMAL LOW (ref 78.0–100.0)
Monocytes Absolute: 0.5 K/uL (ref 0.1–1.0)
Monocytes Relative: 8.2 % (ref 3.0–12.0)
Neutro Abs: 3.2 K/uL (ref 1.4–7.7)
Neutrophils Relative %: 51 % (ref 43.0–77.0)
Platelets: 419 K/uL — ABNORMAL HIGH (ref 150.0–400.0)
RBC: 4.91 Mil/uL (ref 3.87–5.11)
RDW: 17.1 % — ABNORMAL HIGH (ref 11.5–15.5)
WBC: 6.3 K/uL (ref 4.0–10.5)

## 2023-11-30 LAB — LIPID PANEL
Cholesterol: 185 mg/dL (ref 0–200)
HDL: 39.1 mg/dL (ref >39.00)
LDL Cholesterol: 109 mg/dL — ABNORMAL HIGH (ref 0–99)
NonHDL: 146.15
Total CHOL/HDL Ratio: 5
Triglycerides: 185 mg/dL — ABNORMAL HIGH (ref 0.0–149.0)
VLDL: 37 mg/dL (ref 0.0–40.0)

## 2023-11-30 LAB — HEMOGLOBIN A1C: Hgb A1c MFr Bld: 6.3 % (ref 4.6–6.5)

## 2023-11-30 LAB — VITAMIN D 25 HYDROXY (VIT D DEFICIENCY, FRACTURES): VITD: 25.97 ng/mL — ABNORMAL LOW (ref 30.00–100.00)

## 2023-11-30 LAB — VITAMIN B12: Vitamin B-12: 370 pg/mL (ref 211–911)

## 2024-02-04 ENCOUNTER — Encounter: Admitting: Physician Assistant

## 2024-02-24 ENCOUNTER — Encounter: Payer: Self-pay | Admitting: Internal Medicine

## 2024-04-07 ENCOUNTER — Ambulatory Visit: Admitting: *Deleted

## 2024-04-07 VITALS — Ht 67.0 in | Wt 284.0 lb

## 2024-04-07 DIAGNOSIS — Z1211 Encounter for screening for malignant neoplasm of colon: Secondary | ICD-10-CM

## 2024-04-07 MED ORDER — NA SULFATE-K SULFATE-MG SULF 17.5-3.13-1.6 GM/177ML PO SOLN: 1.0000 | Freq: Once | ORAL | 0 refills | Status: AC

## 2024-04-07 NOTE — Progress Notes (Signed)
 Pt's name and DOB verified at the beginning of the pre-visit with 2 identifiers  Pt denies any difficulty with ambulating,sitting, laying down or rolling side to side  Pt has no issues moving head neck or swallowing  No egg or soy allergy known to patient   No issues known to pt with past sedation  No FH of Malignant Hyperthermia  Pt is not on home 02   Pt is not on blood thinners   Pt has frequent issues with constipation RN instructed pt to use Miralax per bottles instructions a week before prep days. Pt states they will  Pt is not on dialysis  Pt denise any abnormal heart rhythms has heart murmur  Pt denies any upcoming cardiac testing  Patient's chart reviewed by Norleen Schillings CNRA prior to pre-visit and patient appropriate for the [facility].  Pre-visit completed and red dot placed by patient's name on their procedure day (on provider's schedule).     Visit in person  Pt states weight is 284 lb   Pt given  both [facility] main # and MD on call # prior to instructions.  Informed pt to come in at the time discussed and is shown on PV instructions.  Pt instructed to use Singlecare.com or GoodRx for a price reduction on prep  Instructed pt where to find PV instructions in My Chart and a copy given to pt Instructed pt on all aspects of written instructions including med holds clothing to wear and foods to eat and not eat as well as after procedure legal restrictions and to call MD on call if needed.. Pt states understanding. Instructed pt to review instructions again prior to procedure and call main # given if has any questions or any issues. Pt states they will.

## 2024-04-13 ENCOUNTER — Encounter: Payer: Self-pay | Admitting: Gastroenterology

## 2024-04-18 ENCOUNTER — Telehealth: Payer: Self-pay | Admitting: *Deleted

## 2024-04-18 DIAGNOSIS — D509 Iron deficiency anemia, unspecified: Secondary | ICD-10-CM

## 2024-04-18 DIAGNOSIS — E538 Deficiency of other specified B group vitamins: Secondary | ICD-10-CM

## 2024-04-18 DIAGNOSIS — E559 Vitamin D deficiency, unspecified: Secondary | ICD-10-CM

## 2024-04-18 NOTE — Telephone Encounter (Unsigned)
 Copied from CRM [id number]. Topic: Clinical - Request for Lab/Test Order >> Apr 18, 2024  2:28 PM Alfonso ORN wrote: Reason for CRM: pt stated that she was to be doing bloodwork for a 3-6 month f/u . Please advise

## 2024-04-19 NOTE — Telephone Encounter (Signed)
 Please call patient and schedule lab appt only. Orders in Epic.

## 2024-04-24 ENCOUNTER — Encounter: Payer: Self-pay | Admitting: Gastroenterology

## 2024-04-24 ENCOUNTER — Ambulatory Visit: Admitting: Gastroenterology

## 2024-04-24 VITALS — BP 129/82 | HR 77 | Temp 98.6°F | Resp 16 | Ht 67.0 in | Wt 284.0 lb

## 2024-04-24 DIAGNOSIS — Z3202 Encounter for pregnancy test, result negative: Secondary | ICD-10-CM | POA: Diagnosis not present

## 2024-04-24 DIAGNOSIS — K562 Volvulus: Secondary | ICD-10-CM | POA: Diagnosis not present

## 2024-04-24 DIAGNOSIS — Z1211 Encounter for screening for malignant neoplasm of colon: Secondary | ICD-10-CM

## 2024-04-24 LAB — POCT URINE PREGNANCY: Preg Test, Ur: NEGATIVE

## 2024-04-24 MED ORDER — SODIUM CHLORIDE 0.9 % IV SOLN: 500.0000 mL | Freq: Once | INTRAVENOUS | Status: DC

## 2024-04-24 NOTE — Progress Notes (Signed)
 [facility] History and Physical   Primary Care Physician:  Job Lukes, [place]   Reason for Procedure:   Colon cancer screening  Plan:    colonoscopy     HPI: Crystal Salinas is a 44 y.o. female  here for colonoscopy screening - 1st time exam.   Patient denies any bowel symptoms at this time. No family history of colon cancer known. Otherwise feels well without any cardiopulmonary symptoms.   I have discussed risks / benefits of anesthesia and endoscopic procedure with Jaxson J Scheuring and they wish to proceed with the exams as outlined today.   The patient was provided an opportunity to ask questions and all were answered. The patient agreed with the plan.    Past Medical History:  Diagnosis Date   Allergy    seasonals   Anxiety    Breast mass    left breast mass   GERD (gastroesophageal reflux disease)    during pregancies   Heart murmur    birth    Past Surgical History:  Procedure Laterality Date   tubes in ears Left 1990    Prior to Admission medications  Medication Sig Start Date End Date Taking? Authorizing Provider  Ascorbic Acid (VITAMIN C) 500 MG CAPS Take 1 capsule by mouth daily.   Yes [provider]  Cholecalciferol (VITAMIN D3) 250 MCG (10000 UT) TABS Take 1 tablet by mouth daily.   Yes [provider]  Magnesium  250 MG TABS Take 1 tablet every other day by mouth.   Yes [provider]  Multiple Vitamins-Minerals (ZINC PO) Take 1 capsule by mouth daily.   Yes [provider]  Omega-3 Fatty Acids (OMEGA-3 FISH OIL PO) Take 1 capsule daily by mouth.   Yes [provider]  OVER THE COUNTER MEDICATION Seamoss 1 tablespoon daily, has Elderberry, Burduck root, Bladder Whack   Yes [provider]  POTASSIUM CHLORIDE PO Take 1 tablet by mouth every other day.   Yes [provider]  pyridOXINE (VITAMIN B-6) 100 MG tablet Take 100 mg by mouth daily.   Yes [provider]   Vitamin D , Ergocalciferol , (DRISDOL ) 1.25 MG (50000 UNIT) CAPS capsule Take 1 capsule (50,000 Units total) by mouth every 7 (seven) days. 11/18/22  Yes Job Lukes, PA  aspirin 81 MG chewable tablet Chew by mouth as needed. Patient not taking: Reported on 04/24/2024    [provider]    Current Outpatient Medications  Medication Sig Dispense Refill   Ascorbic Acid (VITAMIN C) 500 MG CAPS Take 1 capsule by mouth daily.     Cholecalciferol (VITAMIN D3) 250 MCG (10000 UT) TABS Take 1 tablet by mouth daily.     Magnesium  250 MG TABS Take 1 tablet every other day by mouth.     Multiple Vitamins-Minerals (ZINC PO) Take 1 capsule by mouth daily.     Omega-3 Fatty Acids (OMEGA-3 FISH OIL PO) Take 1 capsule daily by mouth.     OVER THE COUNTER MEDICATION Seamoss 1 tablespoon daily, has Elderberry, Burduck root, Bladder Whack     POTASSIUM CHLORIDE PO Take 1 tablet by mouth every other day.     pyridOXINE (VITAMIN B-6) 100 MG tablet Take 100 mg by mouth daily.     Vitamin D , Ergocalciferol , (DRISDOL ) 1.25 MG (50000 UNIT) CAPS capsule Take 1 capsule (50,000 Units total) by mouth every 7 (seven) days. 12 capsule 0   aspirin 81 MG chewable tablet Chew by mouth as needed. (Patient not taking:  Reported on 04/24/2024)     Current Facility-Administered Medications  Medication Dose Route Frequency Provider Last Rate Last Admin   0.9 %  sodium chloride  infusion  500 mL Intravenous Once Venisha Boehning, Elspeth SQUIBB, MD        Allergies as of 04/24/2024 - Review Complete 04/24/2024  Allergen Reaction Noted   Amoxicillin  Rash 08/26/2017    Family History  Problem Relation Age of Onset   Lung cancer Mother        mets to brain; lifelong smoker, chemo, radiation   Multiple sclerosis Father        no significant deficits   Hypertension Brother    Cancer Maternal Aunt    Leukemia Maternal Aunt        CLL   Diabetes Maternal Grandmother    Emphysema Maternal Grandmother    Stroke Paternal  Grandmother    Hypertension Paternal Grandmother    Asthma Daughter    Eczema Daughter    Colon cancer Neg Hx    Colon polyps Neg Hx    Esophageal cancer Neg Hx    Rectal cancer Neg Hx    Stomach cancer Neg Hx     Social History   Socioeconomic History   Marital status: Married    Spouse name: Not on file   Number of children: Not on file   Years of education: Not on file   Highest education level: Not on file  Occupational History   Not on file  Tobacco Use   Smoking status: Never   Smokeless tobacco: Never  Vaping Use   Vaping status: Never Used  Substance and Sexual Activity   Alcohol use: No   Drug use: No   Sexual activity: Yes    Birth control/protection: None  Other Topics Concern   Not on file  Social History Narrative   Nurse, 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   Ages of kids from custody: 21 and 65   Ages of biological kids: 97, 7, 5   Married to husband for 3 years, but together for >20 years   Husband is freight forwarder   Cousin has Huntington's Disease   Social Drivers of Health   Tobacco Use: Low Risk (04/24/2024)   Patient History    Smoking Tobacco Use: Never    Smokeless Tobacco Use: Never    Passive Exposure: Not on file  Financial Resource Strain: Low Risk (11/27/2021)   Overall Financial Resource Strain (CARDIA)    Difficulty of Paying Living Expenses: Not hard at all  Food Insecurity: No Food Insecurity (11/27/2021)   Hunger Vital Sign    Worried About Running Out of Food in the Last Year: Never true    Ran Out of Food in the Last Year: Never true  Transportation Needs: No Transportation Needs (11/27/2021)   PRAPARE - Administrator, Civil Service (Medical): No    Lack of Transportation (Non-Medical): No  Physical Activity: Sufficiently Active (11/27/2021)   Exercise Vital Sign    Days of Exercise per Week: 7 days    Minutes of Exercise per Session: 100 min  Stress: No  Stress Concern Present (11/27/2021)   [employer] of Occupational Health - Occupational Stress Questionnaire    Feeling of Stress : Not at all  Social Connections: Moderately Integrated (11/27/2021)   Social Connection and Isolation Panel    Frequency of Communication with Friends and Family: More than three times a week  Frequency of Social Gatherings with Friends and Family: Once a week    Attends Religious Services: 1 to 4 times per year    Active Member of [employer] or Organizations: No    Attends Banker Meetings: Never    Marital Status: Married  Catering Manager Violence: Not At Risk (11/27/2021)   Humiliation, Afraid, Rape, and Kick questionnaire    Fear of Current or Ex-Partner: No    Emotionally Abused: No    Physically Abused: No    Sexually Abused: No  Depression (PHQ2-9): High Risk (11/29/2023)   Depression (PHQ2-9)    PHQ-2 Score: 12  Alcohol Screen: Low Risk (11/27/2021)   Alcohol Screen    Last Alcohol Screening Score (AUDIT): 0  Housing: Low Risk (11/27/2021)   Housing    Last Housing Risk Score: 0  Utilities: Not on file  Health Literacy: Not on file    Review of Systems: All other review of systems negative except as mentioned in the HPI.  Physical Exam: Vital signs BP 118/79   Pulse 69   Temp 98.6 F (37 C) (Skin)   Ht 5' 7 (1.702 m)   Wt 284 lb (128.8 kg)   SpO2 98%   BMI 44.48 kg/m   General:   Alert,  Well-developed, pleasant and cooperative in NAD Lungs:  Clear throughout to auscultation.   Heart:  Regular rate and rhythm Abdomen:  Soft, nontender and nondistended.   Neuro/Psych:  Alert and cooperative. Normal mood and affect. A and O x 3  Marcey Naval, MD [facility]

## 2024-04-24 NOTE — Op Note (Signed)
 [facility] Patient Name: Crystal Salinas Procedure Date: 04/24/2024 7:46 AM MRN: [id number] Endoscopist: Elspeth P. Leigh , MD, [id number] Age: 44 Referring MD:  Date of Birth: [birth year] Gender: Female Account #: [id number] Procedure:                Colonoscopy Indications:              Screening for colorectal malignant neoplasm, This                            is the patient's first colonoscopy Medicines:                Monitored Anesthesia Care Procedure:                Pre-Anesthesia Assessment:                           - Prior to the procedure, a History and Physical                            was performed, and patient medications and                            allergies were reviewed. The patient's tolerance of                            previous anesthesia was also reviewed. The risks                            and benefits of the procedure and the sedation                            options and risks were discussed with the patient.                            All questions were answered, and informed consent                            was obtained. Prior Anticoagulants: The patient has                            taken no anticoagulant or antiplatelet agents. ASA                            Grade Assessment: III - A patient with severe                            systemic disease. After reviewing the risks and                            benefits, the patient was deemed in satisfactory                            condition to undergo the procedure.  After obtaining informed consent, the colonoscope                            was passed under direct vision. Throughout the                            procedure, the patient's blood pressure, pulse, and                            oxygen saturations were monitored continuously. The                            Olympus CF-HQ190L ([id number]) Colonoscope was                            introduced  through the anus and advanced to the the                            cecum, identified by appendiceal orifice and                            ileocecal valve. The colonoscopy was performed                            without difficulty. The patient tolerated the                            procedure well. The quality of the bowel                            preparation was good. The ileocecal valve,                            appendiceal orifice, and rectum were photographed. Scope In: 8:03:31 AM Scope Out: 8:19:40 AM Scope Withdrawal Time: 0 hours 11 minutes 14 seconds  Total Procedure Duration: 0 hours 16 minutes 9 seconds  Findings:                 The perianal and digital rectal examinations were                            normal.                           The exam was otherwise without abnormality on                            direct and retroflexion views. There was some                            looping in the right colon. Complications:            No immediate complications. Estimated blood loss:                            None.  Estimated Blood Loss:     Estimated blood loss: none. Impression:               - The examination was otherwise normal on direct                            and retroflexion views.                           - No polyps. Recommendation:           - Patient has a contact number available for                            emergencies. The signs and symptoms of potential                            delayed complications were discussed with the                            patient. Return to normal activities tomorrow.                            Written discharge instructions were provided to the                            patient.                           - Resume previous diet.                           - Continue present medications.                           - Repeat colonoscopy in 10 years for screening                            purposes. Elspeth P. Denece Shearer,  MD 04/24/2024 8:24:02 AM This report has been signed electronically.

## 2024-04-24 NOTE — Progress Notes (Signed)
 Sedate, gd SR, tolerated procedure well, VSS, report to RN

## 2024-04-24 NOTE — Patient Instructions (Signed)
-  repeat colonoscopy in 10 years  for surveillance recommended.  -Continue present medications    YOU HAD AN ENDOSCOPIC PROCEDURE TODAY AT THE [facility]:   Refer to the procedure report that was given to you for any specific questions about what was found during the examination.  If the procedure report does not answer your questions, please call your gastroenterologist to clarify.  If you requested that your care partner not be given the details of your procedure findings, then the procedure report has been included in a sealed envelope for you to review at your convenience later.  YOU SHOULD EXPECT: Some feelings of bloating in the abdomen. Passage of more gas than usual.  Walking can help get rid of the air that was put into your GI tract during the procedure and reduce the bloating. If you had a lower endoscopy (such as a colonoscopy or flexible sigmoidoscopy) you may notice spotting of blood in your stool or on the toilet paper. If you underwent a bowel prep for your procedure, you may not have a normal bowel movement for a few days.  Please Note:  You might notice some irritation and congestion in your nose or some drainage.  This is from the oxygen used during your procedure.  There is no need for concern and it should clear up in a day or so.  SYMPTOMS TO REPORT IMMEDIATELY:  Following lower endoscopy (colonoscopy or flexible sigmoidoscopy):  Excessive amounts of blood in the stool  Significant tenderness or worsening of abdominal pains  Swelling of the abdomen that is new, acute  Fever of 100F or higher  For urgent or emergent issues, a gastroenterologist can be reached at any hour by calling [phone]. Do not use MyChart messaging for urgent concerns.    DIET:  We do recommend a small meal at first, but then you may proceed to your regular diet.  Drink plenty of fluids but you should avoid alcoholic beverages for 24 hours.  ACTIVITY:  You should plan to take it  easy for the rest of today and you should NOT DRIVE or use heavy machinery until tomorrow (because of the sedation medicines used during the test).    FOLLOW UP: Our staff will call the number listed on your records the next business day following your procedure.  We will call around 7:15- 8:00 am to check on you and address any questions or concerns that you may have regarding the information given to you following your procedure. If we do not reach you, we will leave a message.     If any biopsies were taken you will be contacted by phone or by letter within the next 1-3 weeks.  Please call us at [phone] if you have not heard about the biopsies in 3 weeks.    SIGNATURES/CONFIDENTIALITY: You and/or your care partner have signed paperwork which will be entered into your electronic medical record.  These signatures attest to the fact that that the information above on your After Visit Summary has been reviewed and is understood.  Full responsibility of the confidentiality of this discharge information lies with you and/or your care-partner.

## 2024-04-25 ENCOUNTER — Telehealth: Payer: Self-pay | Admitting: *Deleted

## 2024-04-25 ENCOUNTER — Other Ambulatory Visit (INDEPENDENT_AMBULATORY_CARE_PROVIDER_SITE_OTHER)

## 2024-04-25 DIAGNOSIS — D509 Iron deficiency anemia, unspecified: Secondary | ICD-10-CM

## 2024-04-25 DIAGNOSIS — E538 Deficiency of other specified B group vitamins: Secondary | ICD-10-CM

## 2024-04-25 DIAGNOSIS — E559 Vitamin D deficiency, unspecified: Secondary | ICD-10-CM

## 2024-04-25 LAB — CBC WITH DIFFERENTIAL/PLATELET
Basophils Absolute: 0 K/uL (ref 0.0–0.1)
Basophils Relative: 0.8 % (ref 0.0–3.0)
Eosinophils Absolute: 0 K/uL (ref 0.0–0.7)
Eosinophils Relative: 0.6 % (ref 0.0–5.0)
HCT: 37.4 % (ref 36.0–46.0)
Hemoglobin: 12.3 g/dL (ref 12.0–15.0)
Lymphocytes Relative: 45.1 % (ref 12.0–46.0)
Lymphs Abs: 2.3 K/uL (ref 0.7–4.0)
MCHC: 32.9 g/dL (ref 30.0–36.0)
MCV: 76 fl — ABNORMAL LOW (ref 78.0–100.0)
Monocytes Absolute: 0.4 K/uL (ref 0.1–1.0)
Monocytes Relative: 8.2 % (ref 3.0–12.0)
Neutro Abs: 2.3 K/uL (ref 1.4–7.7)
Neutrophils Relative %: 45.3 % (ref 43.0–77.0)
Platelets: 409 K/uL — ABNORMAL HIGH (ref 150.0–400.0)
RBC: 4.93 Mil/uL (ref 3.87–5.11)
RDW: 16.3 % — ABNORMAL HIGH (ref 11.5–15.5)
WBC: 5.1 K/uL (ref 4.0–10.5)

## 2024-04-25 LAB — IBC + FERRITIN
Ferritin: 16.1 ng/mL (ref 10.0–291.0)
Iron: 39 ug/dL — ABNORMAL LOW (ref 42–145)
Saturation Ratios: 10.9 % — ABNORMAL LOW (ref 20.0–50.0)
TIBC: 357 ug/dL (ref 250.0–450.0)
Transferrin: 255 mg/dL (ref 212.0–360.0)

## 2024-04-25 LAB — VITAMIN B12: Vitamin B-12: 430 pg/mL (ref 211–911)

## 2024-04-25 LAB — VITAMIN D 25 HYDROXY (VIT D DEFICIENCY, FRACTURES): VITD: 20.7 ng/mL — ABNORMAL LOW (ref 30.00–100.00)

## 2024-04-25 NOTE — Telephone Encounter (Signed)
" °  Follow up Call-     04/24/2024    7:19 AM  Call back number  Post procedure Call Back phone  # [phone]  Permission to leave phone message Yes     Patient questions:  Do you have a fever, pain , or abdominal swelling? No. Pain Score  0 *  Have you tolerated food without any problems? Yes.    Have you been able to return to your normal activities? Yes.    Do you have any questions about your discharge instructions: Diet   No. Medications  No. Follow up visit  No.  Do you have questions or concerns about your Care? No.  Actions: * If pain score is 4 or above: No action needed, pain <4.   "

## 2024-04-26 ENCOUNTER — Ambulatory Visit: Payer: Self-pay | Admitting: Physician Assistant

## 2024-04-26 MED ORDER — VITAMIN D (ERGOCALCIFEROL) 1.25 MG (50000 UNIT) PO CAPS: 50000.0000 [IU] | ORAL_CAPSULE | ORAL | 0 refills | Status: AC

## 2024-11-29 ENCOUNTER — Encounter: Admitting: Physician Assistant
